# Patient Record
Sex: Female | Born: 2000 | Race: Black or African American | Hispanic: No | Marital: Married | Smoking: Never smoker
Health system: Southern US, Community
[De-identification: ages and names within clinical notes are randomized; demographics above are authoritative.]

## PROBLEM LIST (undated history)

## (undated) ENCOUNTER — Ambulatory Visit (HOSPITAL_COMMUNITY): Admission: EM | Payer: No Typology Code available for payment source | Source: Home / Self Care

## (undated) ENCOUNTER — Emergency Department (HOSPITAL_COMMUNITY): Admission: EM | Payer: PRIVATE HEALTH INSURANCE | Source: Home / Self Care

## (undated) DIAGNOSIS — K5090 Crohn's disease, unspecified, without complications: Secondary | ICD-10-CM

## (undated) DIAGNOSIS — J45909 Unspecified asthma, uncomplicated: Secondary | ICD-10-CM

## (undated) HISTORY — DX: Unspecified asthma, uncomplicated: J45.909

## (undated) HISTORY — PX: NO PAST SURGERIES: SHX2092

---
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
## 2020-05-26 ENCOUNTER — Encounter (HOSPITAL_COMMUNITY): Payer: Self-pay | Admitting: Emergency Medicine

## 2020-05-26 ENCOUNTER — Other Ambulatory Visit: Payer: Self-pay

## 2020-05-26 ENCOUNTER — Emergency Department (HOSPITAL_COMMUNITY): Payer: PRIVATE HEALTH INSURANCE

## 2020-05-26 ENCOUNTER — Emergency Department (HOSPITAL_COMMUNITY)
Admission: EM | Admit: 2020-05-26 | Discharge: 2020-05-26 | Disposition: A | Discharge status: Home / Self Care | Payer: PRIVATE HEALTH INSURANCE | Attending: Emergency Medicine | Admitting: Emergency Medicine

## 2020-05-26 DIAGNOSIS — Z20822 Contact with and (suspected) exposure to covid-19: Secondary | ICD-10-CM | POA: Insufficient documentation

## 2020-05-26 DIAGNOSIS — R0789 Other chest pain: Secondary | ICD-10-CM | POA: Diagnosis not present

## 2020-05-26 DIAGNOSIS — Z8616 Personal history of COVID-19: Secondary | ICD-10-CM | POA: Diagnosis not present

## 2020-05-26 DIAGNOSIS — J45909 Unspecified asthma, uncomplicated: Secondary | ICD-10-CM | POA: Insufficient documentation

## 2020-05-26 DIAGNOSIS — J069 Acute upper respiratory infection, unspecified: Secondary | ICD-10-CM | POA: Insufficient documentation

## 2020-05-26 DIAGNOSIS — R0981 Nasal congestion: Secondary | ICD-10-CM

## 2020-05-26 HISTORY — DX: Crohn's disease, unspecified, without complications: K50.90

## 2020-05-26 MED ORDER — ALBUTEROL SULFATE HFA 108 (90 BASE) MCG/ACT IN AERS: 2.0000 | INHALATION_SPRAY | RESPIRATORY_TRACT | 0 refills | Status: DC | PRN

## 2020-05-26 MED ORDER — AEROCHAMBER PLUS FLO-VU MEDIUM MISC
1.0000 | Freq: Once | Status: AC
  Administered 2020-05-26: 1
  Filled 2020-05-26 (×2): qty 1

## 2020-05-26 MED ORDER — BENZONATATE 100 MG PO CAPS: 100.0000 mg | ORAL_CAPSULE | Freq: Three times a day (TID) | ORAL | 0 refills | Status: DC | PRN

## 2020-05-26 MED ORDER — ALBUTEROL SULFATE HFA 108 (90 BASE) MCG/ACT IN AERS
2.0000 | INHALATION_SPRAY | RESPIRATORY_TRACT | Status: DC | PRN
  Administered 2020-05-26: 2 via RESPIRATORY_TRACT
  Filled 2020-05-26: qty 6.7

## 2020-05-26 MED ORDER — FLUTICASONE PROPIONATE 50 MCG/ACT NA SUSP: 2.0000 | Freq: Every day | NASAL | 0 refills | Status: DC

## 2020-05-26 MED ORDER — NAPROXEN 250 MG PO TABS: 500.0000 mg | ORAL_TABLET | Freq: Two times a day (BID) | ORAL | 0 refills | Status: DC | PRN

## 2020-05-26 NOTE — ED Triage Notes (Signed)
Pt c/o cough, chest pain/buring in left upper chest, shob, body aches. Endorses nausea, denies vomiting. Denies covid exposure. Sx began 2-3 days ago. Pt states she's taken mucinex with no relief.

## 2020-05-26 NOTE — Discharge Instructions (Addendum)
1. Medications: flonase, tessalon, albuterol, naprosyn, usual home medications 2. Treatment: rest, drink plenty of fluids, take tylenol or ibuprofen for fever control 3. Follow Up: Please followup with your primary doctor in 3 days for discussion of your diagnoses and further evaluation after today's visit; if you do not have a primary care doctor use the resource guide provided to find one; Return to the ER for high fevers, difficulty breathing or other concerning symptoms

## 2020-05-26 NOTE — ED Provider Notes (Signed)
[facility]-EMERGENCY DEPT Provider Note   CSN: [id number] Arrival date & time: 05/26/20  2206     History No chief complaint on file.   Caitlin Anthony is a 19 y.o. female with a hx of asthma, crohn's disease (on [place]) presents to the Emergency Department complaining of gradual, persistent, progressively worsening URI symptoms onset 3 days ago.  Pt reports associated nasal congestion, sore throat, bilateral otalgia and myalgias, cough, nausea without vomiting.  Patient reports that she does have some chest wall pain with coughing but no chest pain with breathing or exertion or at rest.  Patient reports taking Flonase and Mucinex without significant improvement.  She had Covid in August 2021.  She is not currently vaccinated.    The history is provided by the patient and medical records. No language interpreter was used.       Past Medical History:  Diagnosis Date  . Asthma   . Crohn's disease (HCC)     There are no problems to display for this patient.   History reviewed. No pertinent surgical history.   OB History   No obstetric history on file.     Family History  Problem Relation Age of Onset  . Asthma Other   . Diabetes Other     Social History   Tobacco Use  . Smoking status: Never Smoker  . Smokeless tobacco: Never Used  Substance Use Topics  . Alcohol use: Never  . Drug use: Never    Home Medications Prior to Admission medications   Medication Sig Start Date End Date Taking? Authorizing Provider  albuterol (VENTOLIN HFA) 108 (90 Base) MCG/ACT inhaler Inhale 2 puffs into the lungs every 2 (two) hours as needed for wheezing or shortness of breath (cough). 05/26/20   Kemara Quigley, Dahlia Client, PA-C  benzonatate (TESSALON PERLES) 100 MG capsule Take 1 capsule (100 mg total) by mouth 3 (three) times daily as needed for cough (cough). 05/26/20   Tanessa Tidd, Dahlia Client, PA-C  buPROPion (WELLBUTRIN XL) 300 MG 24 hr tablet Take 300 mg by mouth daily.     [provider]  busPIRone (BUSPAR) 10 MG tablet Take 10 mg by mouth 3 (three) times daily.    [provider]  fluticasone (FLONASE) 50 MCG/ACT nasal spray Place 2 sprays into both nostrils daily. 05/26/20   Kyrin Gratz, Dahlia Client, PA-C  naproxen (NAPROSYN) 250 MG tablet Take 2 tablets (500 mg total) by mouth 2 (two) times daily as needed for mild pain. 05/26/20   Wafaa Deemer, Dahlia Client, PA-C  phentermine (ADIPEX-P) 37.5 MG tablet Take 37.5 mg by mouth daily. 10/17/19   [provider]  Vitamin D, Ergocalciferol, (DRISDOL) 1.25 MG (50000 UNIT) CAPS capsule Take 50,000 Units by mouth every Monday. 09/19/19   [provider]    Allergies    Pineapple  Review of Systems   Review of Systems  Constitutional: Positive for appetite change and fatigue. Negative for diaphoresis, fever and unexpected weight change.  HENT: Positive for congestion, postnasal drip, rhinorrhea, sinus pressure and sore throat. Negative for mouth sores.   Eyes: Negative for visual disturbance.  Respiratory: Positive for cough and shortness of breath. Negative for chest tightness and wheezing.   Cardiovascular: Negative for chest pain.  Gastrointestinal: Positive for nausea. Negative for abdominal pain, constipation, diarrhea and vomiting.  Endocrine: Negative for polydipsia, polyphagia and polyuria.  Genitourinary: Negative for dysuria, frequency, hematuria and urgency.  Musculoskeletal: Negative for back pain and neck stiffness.  Skin: Negative for rash.  Allergic/Immunologic: Negative for immunocompromised  state.  Neurological: Negative for syncope, light-headedness and headaches.  Hematological: Does not bruise/bleed easily.  Psychiatric/Behavioral: Negative for sleep disturbance. The patient is not nervous/anxious.     Physical Exam Updated Vital Signs BP (!) 159/88   Pulse 94   Temp 98.5 F (36.9 C) (Oral)   Resp 18   Ht 5\' 4"  (1.626 m)   Wt (!) 140.6 kg   LMP 04/30/2020    SpO2 99%   BMI 53.21 kg/m   Physical Exam Vitals and nursing note reviewed.  Constitutional:      General: She is not in acute distress.    Appearance: She is not diaphoretic.  HENT:     Head: Normocephalic and atraumatic.     Right Ear: Tympanic membrane normal.     Left Ear: Tympanic membrane normal.     Nose: Congestion and rhinorrhea present.     Mouth/Throat:     Mouth: Mucous membranes are moist.     Pharynx: Oropharynx is clear. No pharyngeal swelling, oropharyngeal exudate, posterior oropharyngeal erythema or uvula swelling.  Eyes:     General: No scleral icterus.    Conjunctiva/sclera: Conjunctivae normal.  Cardiovascular:     Rate and Rhythm: Normal rate and regular rhythm.     Pulses: Normal pulses.          Radial pulses are 2+ on the right side and 2+ on the left side.  Pulmonary:     Effort: No tachypnea, accessory muscle usage, prolonged expiration, respiratory distress or retractions.     Breath sounds: Normal breath sounds. No stridor.     Comments: Equal chest rise. No increased work of breathing. Abdominal:     General: There is no distension.     Palpations: Abdomen is soft.     Tenderness: There is no abdominal tenderness. There is no guarding or rebound.  Musculoskeletal:     Cervical back: Normal range of motion.     Comments: Moves all extremities equally and without difficulty.  Skin:    General: Skin is warm and dry.     Capillary Refill: Capillary refill takes less than 2 seconds.  Neurological:     Mental Status: She is alert.     GCS: GCS eye subscore is 4. GCS verbal subscore is 5. GCS motor subscore is 6.     Comments: Speech is clear and goal oriented.  Psychiatric:        Mood and Affect: Mood normal.     ED Results / Procedures / Treatments   Labs (all labs ordered are listed, but only abnormal results are displayed) Labs Reviewed  RESPIRATORY PANEL BY RT PCR (FLU A&B, COVID)    EKG None  Radiology DG Chest Port 1  View  Result Date: 05/26/2020 CLINICAL DATA:  Nonproductive cough and congestion for 3 days. History of COVID infection a couple of months ago. Asthma. Nonsmoker. EXAM: PORTABLE CHEST 1 VIEW COMPARISON:  None. FINDINGS: The heart size and mediastinal contours are within normal limits. Both lungs are clear. The visualized skeletal structures are unremarkable. IMPRESSION: No active disease. Electronically Signed   By: 05/28/2020 M.D.   On: 05/26/2020 22:37    Procedures Procedures (including critical care time)  Medications Ordered in ED Medications  albuterol (VENTOLIN HFA) 108 (90 Base) MCG/ACT inhaler 2 puff (has no administration in time range)  AeroChamber Plus Flo-Vu Medium MISC 1 each (has no administration in time range)    ED Course  I have reviewed the triage vital  signs and the nursing notes.  Pertinent labs & imaging results that were available during my care of the patient were reviewed by me and considered in my medical decision making (see chart for details).    MDM Rules/Calculators/A&P                           Caitlin Anthony was evaluated in Emergency Department on 05/26/2020 for the symptoms described in the history of present illness. She was evaluated in the context of the global COVID-19 pandemic, which necessitated consideration that the patient might be at risk for infection with the SARS-CoV-2 virus that causes COVID-19. Institutional protocols and algorithms that pertain to the evaluation of patients at risk for COVID-19 are in a state of rapid change based on information released by regulatory bodies including the CDC and federal and state organizations. These policies and algorithms were followed during the patient's care in the ED.  Patient presents with URI symptoms.  Recently had Covid.  No hypoxia today.  No wheezing on my exam.  Afebrile.  Chest x-ray without evidence of consolidation.  Suspect viral URI.  Covid test pending.  Patient will follow Covid test  on MyChart.  Symptomatic therapy given.  Discussed reasons to return immediately to the emergency department.  Patient states understanding and is in agreement the plan.  Final Clinical Impression(s) / ED Diagnoses Final diagnoses:  Upper respiratory tract infection, unspecified type  Nasal congestion    Rx / DC Orders ED Discharge Orders         Ordered    fluticasone (FLONASE) 50 MCG/ACT nasal spray  Daily        05/26/20 2304    albuterol (VENTOLIN HFA) 108 (90 Base) MCG/ACT inhaler  Every 2 hours PRN        05/26/20 2304    naproxen (NAPROSYN) 250 MG tablet  2 times daily PRN        05/26/20 2304    benzonatate (TESSALON PERLES) 100 MG capsule  3 times daily PRN        05/26/20 2304           Demontay Grantham, Boyd Kerbs 05/26/20 2305    Lorre Nick, MD 05/27/20 1540

## 2020-05-27 LAB — RESPIRATORY PANEL BY RT PCR (FLU A&B, COVID)
Influenza A by PCR: NEGATIVE
Influenza B by PCR: NEGATIVE
SARS Coronavirus 2 by RT PCR: NEGATIVE

## 2020-08-01 ENCOUNTER — Other Ambulatory Visit: Payer: Self-pay

## 2020-09-23 IMAGING — CT CT ABD-PELV W/ CM
2 of 4 series · 17 of 46 positions shown, 19 images · IV contrast (omnipaque)
Comparison: None.

CLINICAL DATA: Generalized abdominal pain, question of Crohn's

EXAM:
CT ABDOMEN AND PELVIS WITH CONTRAST
TECHNIQUE: Multidetector CT imaging of the abdomen and pelvis was performed
using the standard protocol following bolus administration of
intravenous contrast.
CONTRAST:  100mL OMNIPAQUE IOHEXOL 300 MG/ML  SOLN

[Series 3: abdomen 5.0 · axial · 0.97mm/px · z∈[+610,+1035]mm · 14 of 97 slices shown, 16 images]
[im 6/97  soft-tissue]
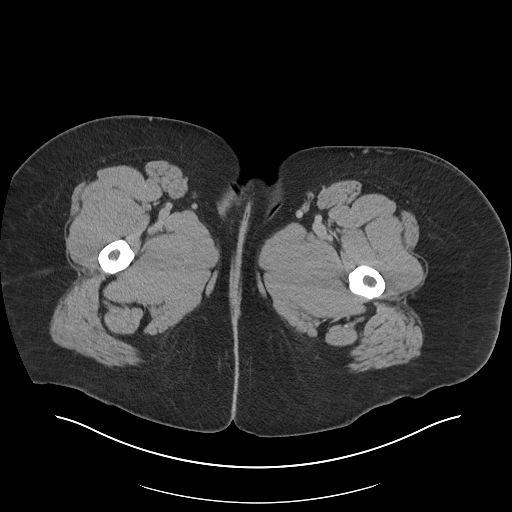
[im 6/97  bone]
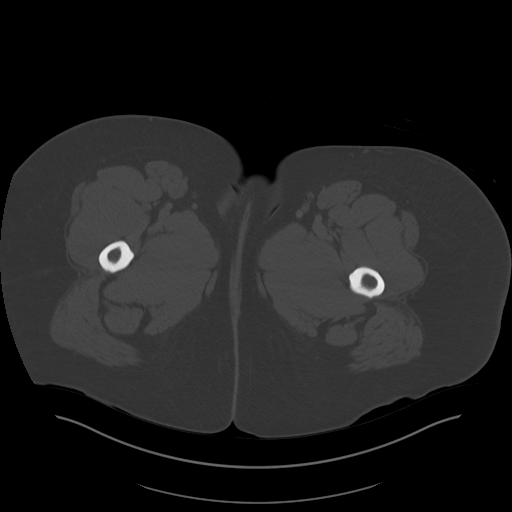
[im 11/97  soft-tissue]
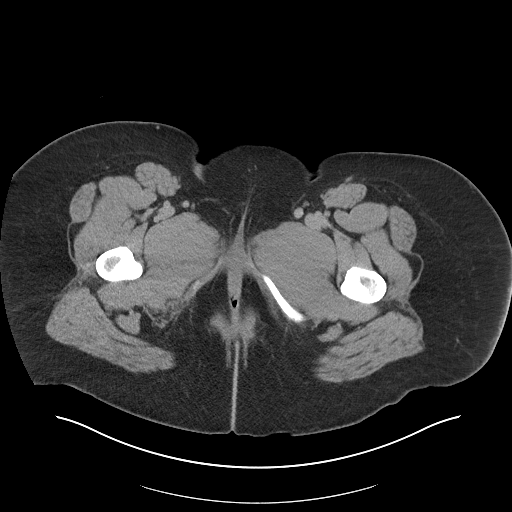
[im 22/97  soft-tissue]
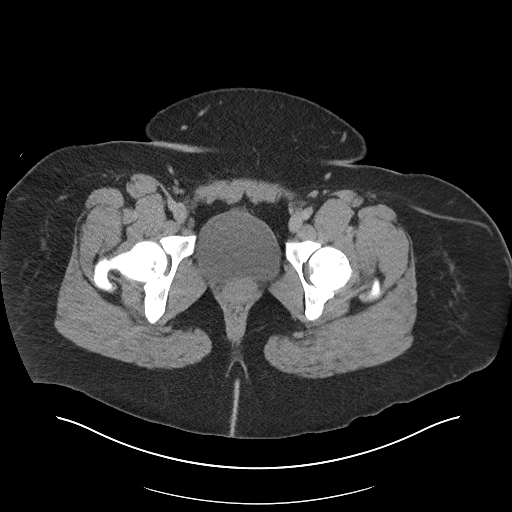
[im 27/97  soft-tissue]
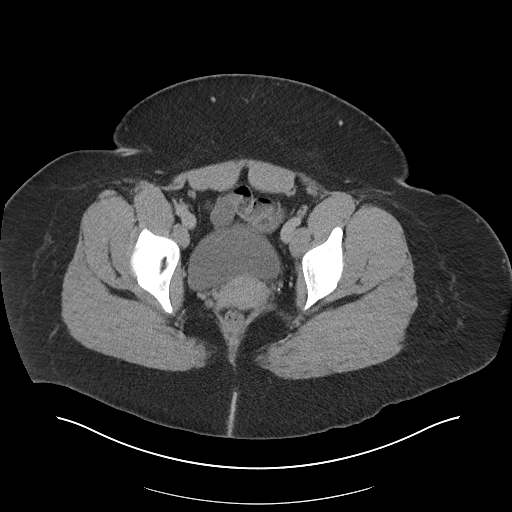
[im 33/97  soft-tissue]
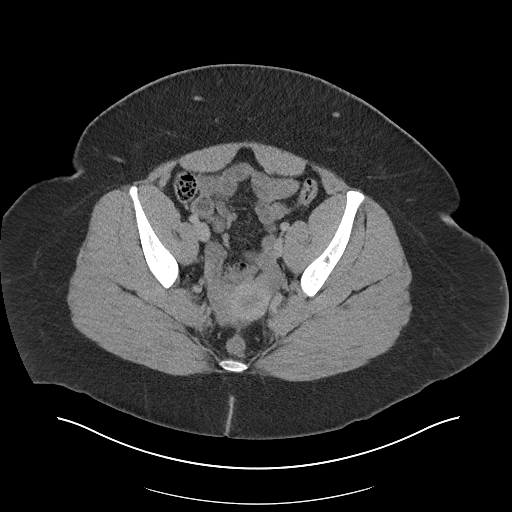
[im 38/97  soft-tissue]
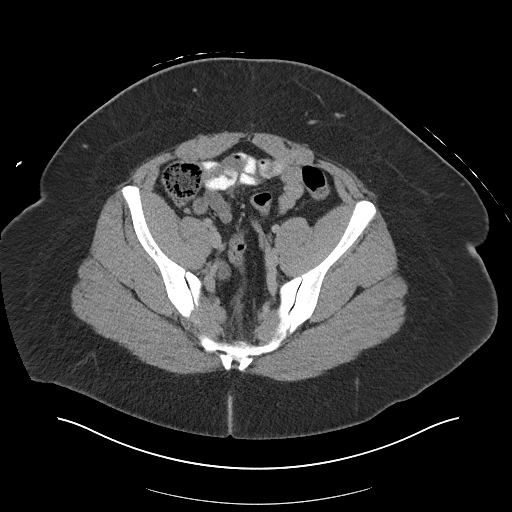
[im 43/97  soft-tissue]
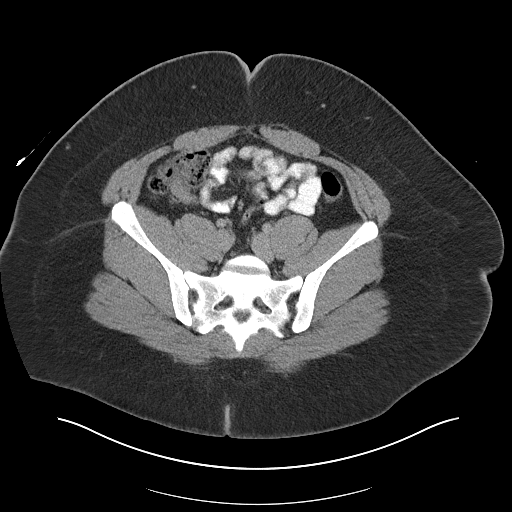
[im 54/97  soft-tissue]
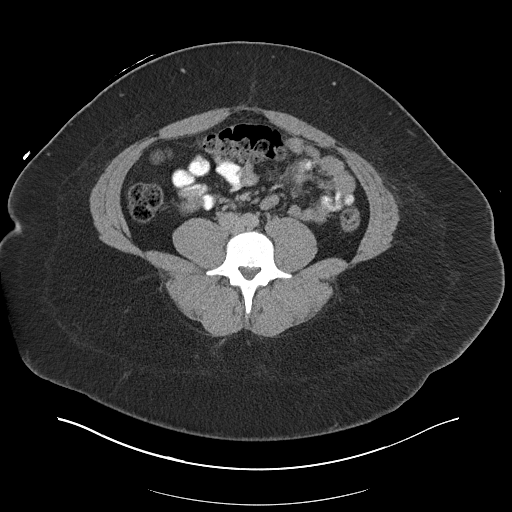
[im 59/97  soft-tissue]
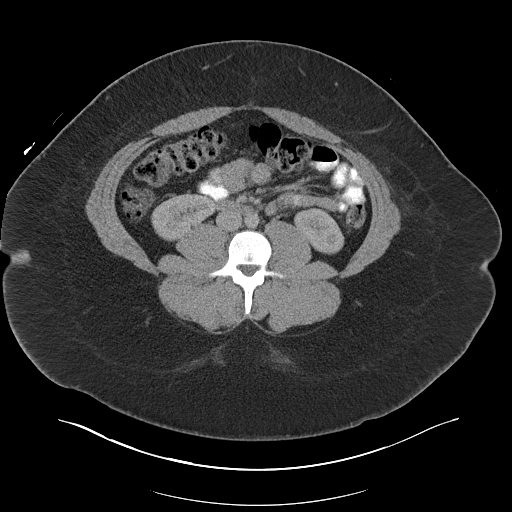
[im 59/97  bone]
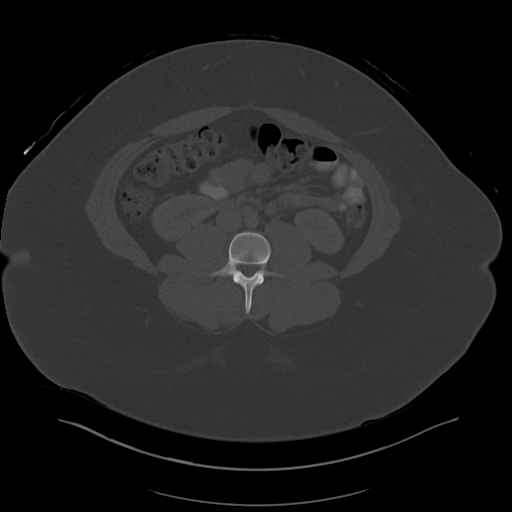
[im 65/97  soft-tissue]
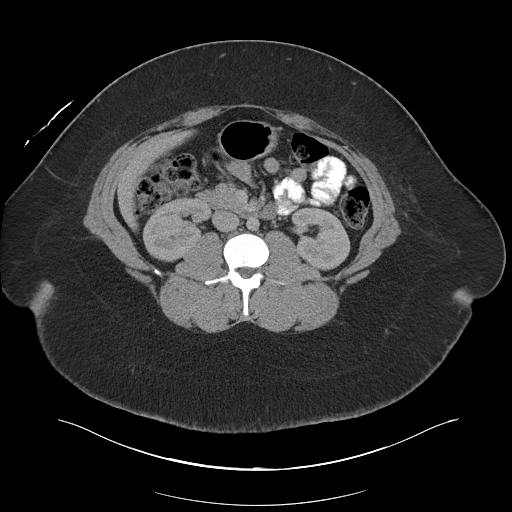
[im 70/97  soft-tissue]
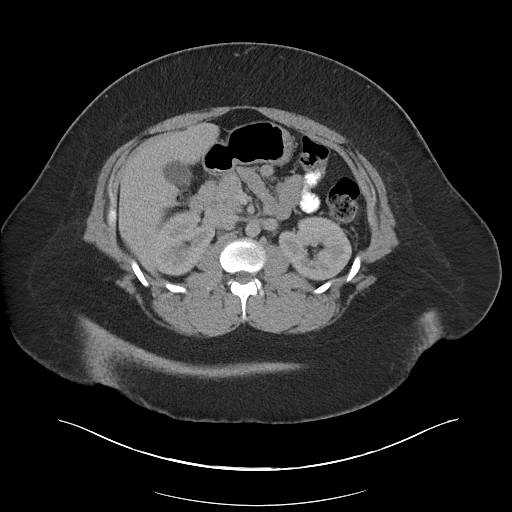
[im 75/97  soft-tissue]
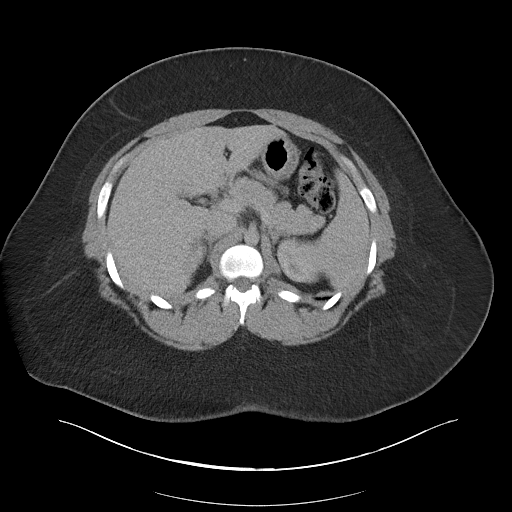
[im 86/97  soft-tissue]
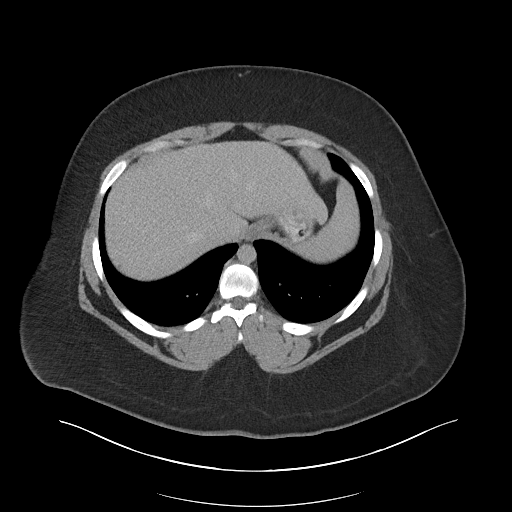
[im 91/97  soft-tissue]
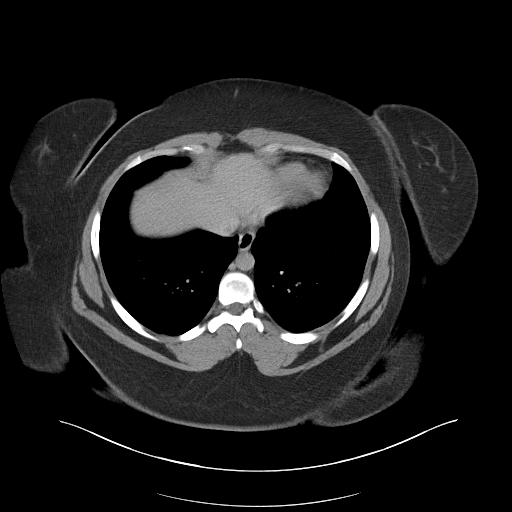

[Series 6: abdomen 3.0 mpr cor · coronal · 0.91mm/px · 3 of 132 slices shown]
[im 44/132  soft-tissue]
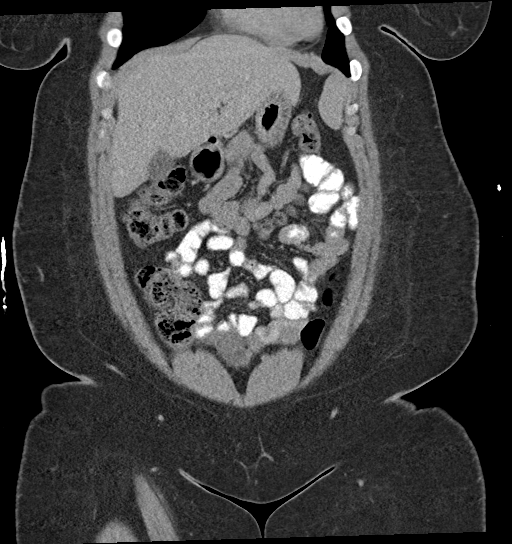
[im 59/132  soft-tissue]
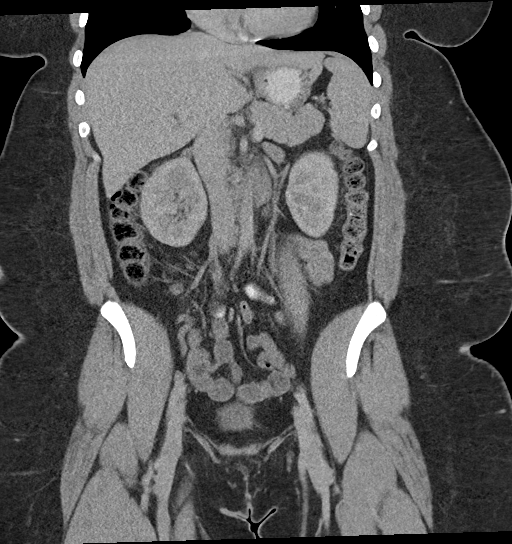
[im 73/132  soft-tissue]
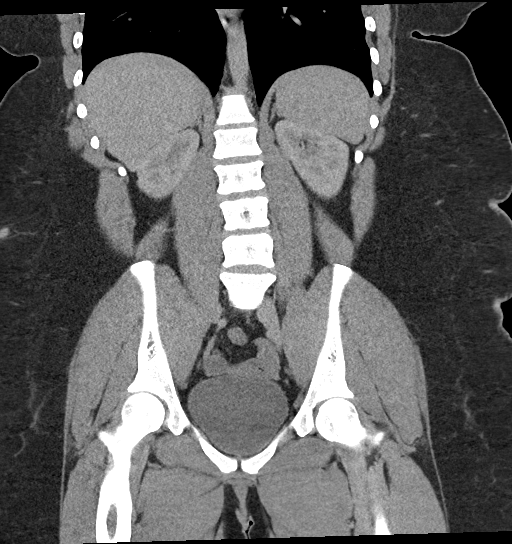

[17 of 46 positions shown; findings below may reference images not displayed]

FINDINGS: Lower chest: The visualized heart size within normal limits. No
pericardial fluid/thickening.

No hiatal hernia.

The visualized portions of the lungs are clear.

Hepatobiliary: The liver is normal in density without focal
abnormality.The main portal vein is patent. No evidence of calcified
gallstones, gallbladder wall thickening or biliary dilatation.

Pancreas: Unremarkable. No pancreatic ductal dilatation or
surrounding inflammatory changes.

Spleen: Normal in size without focal abnormality.

Adrenals/Urinary Tract: Both adrenal glands appear normal. The
kidneys and collecting system appear normal without evidence of
urinary tract calculus or hydronephrosis. Bladder is unremarkable.

Stomach/Bowel: The stomach, small bowel, and colon are normal in
appearance. No inflammatory changes, wall thickening, or obstructive
findings.The appendix is normal.

Vascular/Lymphatic: There are no enlarged mesenteric,
retroperitoneal, or pelvic lymph nodes. No significant vascular
findings are present.

Reproductive: The uterus and adnexa are unremarkable.

Other: Small fat containing anterior umbilical hernia is noted.

Musculoskeletal: No acute or significant osseous findings.
IMPRESSION: No acute intra-abdominal or pelvic pathology to explain the
patient's symptoms.

## 2021-04-24 IMAGING — DX DG CHEST 1V PORT
1 series · 1 of 1 positions shown · non-contrast
Comparison: None.

CLINICAL DATA: Nonproductive cough and congestion for 3 days.
History of COVID infection a couple of months ago. Asthma.
Nonsmoker.

EXAM:
PORTABLE CHEST 1 VIEW

[chest ap]
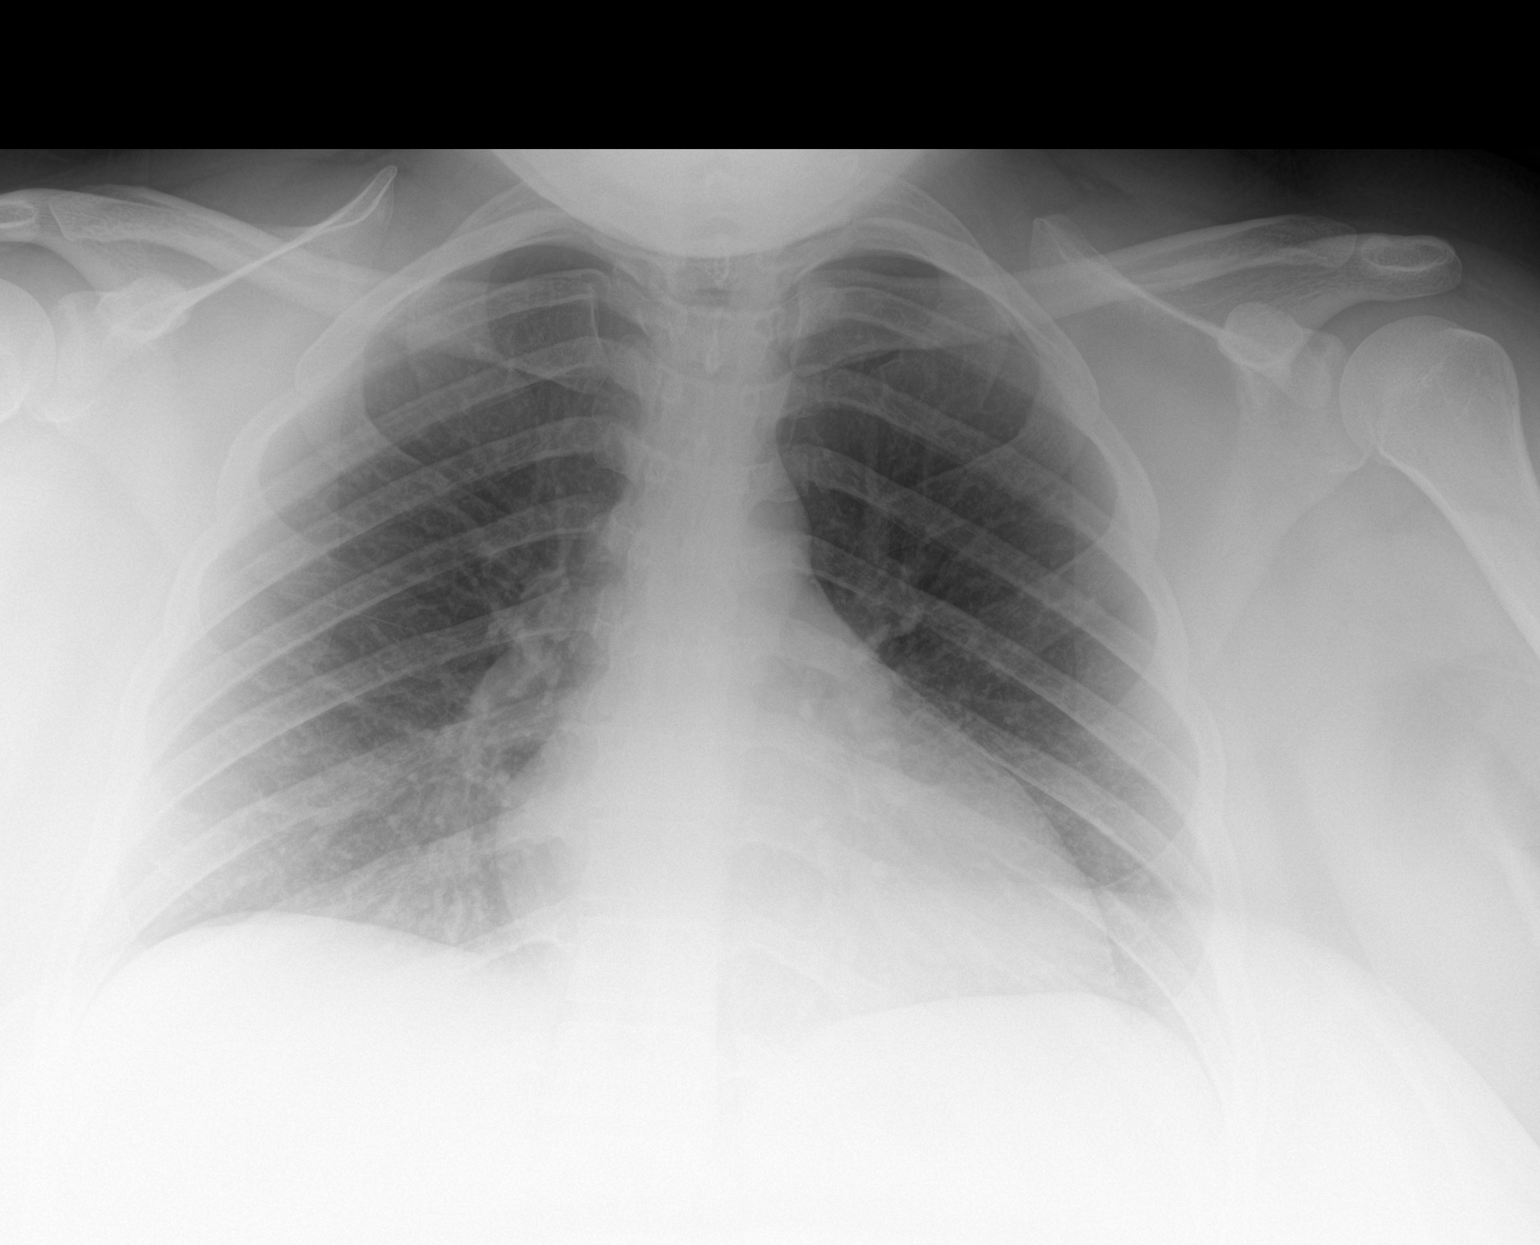

[1 of 1 positions shown; findings below may reference images not displayed]

FINDINGS: The heart size and mediastinal contours are within normal limits.
Both lungs are clear. The visualized skeletal structures are
unremarkable.
IMPRESSION: No active disease.

## 2021-11-16 ENCOUNTER — Ambulatory Visit (HOSPITAL_COMMUNITY)
Admission: EM | Admit: 2021-11-16 | Discharge: 2021-11-16 | Disposition: A | Discharge status: Home / Self Care | Payer: PRIVATE HEALTH INSURANCE | Attending: Nurse Practitioner | Admitting: Nurse Practitioner

## 2021-11-16 ENCOUNTER — Encounter (HOSPITAL_COMMUNITY): Payer: Self-pay | Admitting: Nurse Practitioner

## 2021-11-16 DIAGNOSIS — W25XXXA Contact with sharp glass, initial encounter: Secondary | ICD-10-CM

## 2021-11-16 DIAGNOSIS — Z23 Encounter for immunization: Secondary | ICD-10-CM

## 2021-11-16 DIAGNOSIS — S91342A Puncture wound with foreign body, left foot, initial encounter: Secondary | ICD-10-CM

## 2021-11-16 MED ORDER — TETANUS-DIPHTH-ACELL PERTUSSIS 5-2.5-18.5 LF-MCG/0.5 IM SUSY
PREFILLED_SYRINGE | INTRAMUSCULAR | Status: AC
  Filled 2021-11-16: qty 0.5

## 2021-11-16 MED ORDER — LIDOCAINE HCL (PF) 1 % IJ SOLN
INTRAMUSCULAR | Status: AC
  Filled 2021-11-16: qty 2

## 2021-11-16 MED ORDER — TETANUS-DIPHTH-ACELL PERTUSSIS 5-2.5-18.5 LF-MCG/0.5 IM SUSY
0.5000 mL | PREFILLED_SYRINGE | Freq: Once | INTRAMUSCULAR | Status: AC
  Administered 2021-11-16: 0.5 mL via INTRAMUSCULAR

## 2021-11-16 NOTE — ED Provider Notes (Signed)
?[facility] ? ? ? ?CSN: [id number] ?Arrival date & time: 11/16/21  1721 ? ? ?  ? ?History   ?Chief Complaint ?Chief Complaint  ?Patient presents with  ? Puncture Wound  ? ? ?HPI ?Caitlin Anthony is a 21 y.o. female.  ? ?The patient is a 21 year old female who presents with glass in the right foot.  Patient states that she was at home she stepped down and felt the glass into her foot.  States that she and her friend tried to take it out but she could not grab it.  She has pain with walking.  The glass is located in the lateral aspect of the right forefoot.  She denies fever, chills, radiation of pain.  She cannot recall when she had her last tetanus shot. ? ?The history is provided by the patient.  ? ?Past Medical History:  ?Diagnosis Date  ? Asthma   ? Crohn's disease (HCC)   ? ? ?There are no problems to display for this patient. ? ? ?History reviewed. No pertinent surgical history. ? ?OB History   ?No obstetric history on file. ?  ? ? ? ?Home Medications   ? ?Prior to Admission medications   ?Medication Sig Start Date End Date Taking? Authorizing Provider  ?albuterol (VENTOLIN HFA) 108 (90 Base) MCG/ACT inhaler Inhale 2 puffs into the lungs every 2 (two) hours as needed for wheezing or shortness of breath (cough). 05/26/20   Muthersbaugh, Dahlia Client, PA-C  ?benzonatate (TESSALON PERLES) 100 MG capsule Take 1 capsule (100 mg total) by mouth 3 (three) times daily as needed for cough (cough). 05/26/20   Muthersbaugh, Dahlia Client, PA-C  ?buPROPion (WELLBUTRIN XL) 300 MG 24 hr tablet Take 300 mg by mouth daily.    [provider]  ?busPIRone (BUSPAR) 10 MG tablet Take 10 mg by mouth 3 (three) times daily.    [provider]  ?fluticasone (FLONASE) 50 MCG/ACT nasal spray Place 2 sprays into both nostrils daily. 05/26/20   Muthersbaugh, Dahlia Client, PA-C  ?naproxen (NAPROSYN) 250 MG tablet Take 2 tablets (500 mg total) by mouth 2 (two) times daily as needed for mild pain. 05/26/20   Muthersbaugh, Dahlia Client, PA-C   ?phentermine (ADIPEX-P) 37.5 MG tablet Take 37.5 mg by mouth daily. 10/17/19   [provider]  ?Vitamin D, Ergocalciferol, (DRISDOL) 1.25 MG (50000 UNIT) CAPS capsule Take 50,000 Units by mouth every Monday. 09/19/19   [provider]  ? ? ?Family History ?Family History  ?Problem Relation Age of Onset  ? Asthma Other   ? Diabetes Other   ? ? ?Social History ?Social History  ? ?Tobacco Use  ? Smoking status: Never  ? Smokeless tobacco: Never  ?Substance Use Topics  ? Alcohol use: Never  ? Drug use: Never  ? ? ? ?Allergies   ?Pineapple ? ? ?Review of Systems ?Review of Systems  ?Constitutional: Negative.   ?Skin:   ?     Glass in right foot.  ?Psychiatric/Behavioral: Negative.    ? ? ?Physical Exam ?Triage Vital Signs ?ED Triage Vitals  ?Enc Vitals Group  ?   BP 11/16/21 1812 (!) 138/93  ?   Pulse Rate 11/16/21 1812 92  ?   Resp 11/16/21 1812 16  ?   Temp 11/16/21 1812 99.1 ?F (37.3 ?C)  ?   Temp Source 11/16/21 1812 Oral  ?   SpO2 11/16/21 1812 100 %  ?   Weight --   ?   Height --   ?   Head  Circumference --   ?   Peak Flow --   ?   Pain Score 11/16/21 1813 6  ?   Pain Loc --   ?   Pain Edu? --   ?   Excl. in GC? --   ? ?No data found. ? ?Updated Vital Signs ?BP (!) 138/93 (BP Location: Left Arm)   Pulse 92   Temp 99.1 ?F (37.3 ?C) (Oral)   Resp 16   LMP 11/03/2021   SpO2 100%  ? ?Visual Acuity ?Right Eye Distance:   ?Left Eye Distance:   ?Bilateral Distance:   ? ?Right Eye Near:   ?Left Eye Near:    ?Bilateral Near:    ? ?Physical Exam ?Vitals reviewed.  ?Constitutional:   ?   Appearance: Normal appearance.  ?Skin: ?   General: Skin is warm and dry.  ?   Capillary Refill: Capillary refill takes less than 2 seconds.  ?   Comments: Tenderness to lateral aspect of the right forefoot. Small puncture wound noted to the forefoot with scant bleeding noted. Able to palpate object in the area.  ?Neurological:  ?   General: No focal deficit present.  ?   Mental Status: She is alert and oriented to  person, place, and time.  ? ? ? ?UC Treatments / Results  ?Labs ?(all labs ordered are listed, but only abnormal results are displayed) ?Labs Reviewed - No data to display ? ?EKG ? ? ?Radiology ?No results found. ? ?Procedures ?Foreign Body Removal ? ?Date/Time: 11/16/2021 7:14 PM ?Performed by: Abran Cantor, NP ?Authorized by: Abran Cantor, NP  ? ?Consent:  ?  Consent obtained:  Verbal ?  Consent given by:  Patient ?  Risks discussed:  Bleeding and infection ?  Alternatives discussed:  No treatment ?Universal protocol:  ?  Patient identity confirmed:  Verbally with patient and arm band ?Location:  ?  Location:  Foot ?  Foot location: lateral forefoot. ?  Depth:  Subcutaneous ?  Tendon involvement:  None ?Pre-procedure details:  ?  Imaging:  None ?Anesthesia:  ?  Anesthesia method:  Local infiltration ?  Local anesthetic:  Lidocaine 1% w/o epi ?Procedure type:  ?  Procedure complexity:  Simple ?Procedure details:  ?  Localization method:  Probed ?  Dissection of underlying tissues: no   ?  Foreign bodies recovered:  1 ?  Description:  81mm piece of glass removed from the right foot ?  Intact foreign body removal: yes   ?Post-procedure details:  ?  Neurovascular status: intact   ?  Procedure completion:  Tolerated well, no immediate complications (including critical care time) ? ?Medications Ordered in UC ?Medications  ?Tdap (BOOSTRIX) injection 0.5 mL (has no administration in time range)  ? ? ?Initial Impression / Assessment and Plan / UC Course  ?I have reviewed the triage vital signs and the nursing notes. ? ?Pertinent labs & imaging results that were available during my care of the patient were reviewed by me and considered in my medical decision making (see chart for details). ? ?Patient is a 21 year old female who presents after stepping on a piece of glass with the right foot.  Patient states she could feel the object in the bottom of the foot.  On exam, they foreign object is palpated  in the right foot.  Removal of the foreign object was completed with the assistance of Dewaine Conger, FNP.  A 5 mm piece of glass was removed, with immediate relief from the  patient.  Patient was advised that she may have continued soreness until the area heals completely.  Wear shoes that provide good padding and support.  Monitor for signs of infection to include fever, chills, malaise, foul-smelling drainage, redness or streaking.  Follow-up as needed. ?Final Clinical Impressions(s) / UC Diagnoses  ? ?Final diagnoses:  ?Injury from broken glass, initial encounter  ? ? ? ?Discharge Instructions   ? ?  ?A 5 mm piece of glass was removed from the right foot today. ?You may continue to have soreness in the foot until healing is complete. ?You may take over-the-counter ibuprofen as needed for pain, fever, or general discomfort. ?Your tetanus shot was also updated today.  You will be good for the next 10 years. ?Wear shoes with good padding to help alleviate some of the pressure on the right foot. ?Monitor for signs of infection to include increased redness, drainage, fever, chills, or general fatigue. ?Follow-up as needed. ? ? ? ? ?ED Prescriptions   ?None ?  ? ?PDMP not reviewed this encounter. ?  ?Abran Cantor, NP ?11/16/21 1923 ? ?

## 2021-11-16 NOTE — Discharge Instructions (Signed)
A 5 mm piece of glass was removed from the right foot today. ?You may continue to have soreness in the foot until healing is complete. ?You may take over-the-counter ibuprofen as needed for pain, fever, or general discomfort. ?Your tetanus shot was also updated today.  You will be good for the next 10 years. ?Wear shoes with good padding to help alleviate some of the pressure on the right foot. ?Monitor for signs of infection to include increased redness, drainage, fever, chills, or general fatigue. ?Follow-up as needed. ?

## 2021-11-16 NOTE — ED Triage Notes (Signed)
C/o glass in her right foot.  ?

## 2022-01-31 ENCOUNTER — Encounter (HOSPITAL_BASED_OUTPATIENT_CLINIC_OR_DEPARTMENT_OTHER): Payer: Self-pay | Admitting: Emergency Medicine

## 2022-01-31 ENCOUNTER — Emergency Department (HOSPITAL_BASED_OUTPATIENT_CLINIC_OR_DEPARTMENT_OTHER)
Admission: EM | Admit: 2022-01-31 | Discharge: 2022-01-31 | Disposition: A | Discharge status: Home / Self Care | Payer: Medicaid Other | Attending: Emergency Medicine | Admitting: Emergency Medicine

## 2022-01-31 ENCOUNTER — Other Ambulatory Visit: Payer: Self-pay

## 2022-01-31 DIAGNOSIS — R1032 Left lower quadrant pain: Secondary | ICD-10-CM

## 2022-01-31 LAB — CBC
HCT: 39.5 % (ref 36.0–46.0)
Hemoglobin: 12.3 g/dL (ref 12.0–15.0)
MCH: 24.1 pg — ABNORMAL LOW (ref 26.0–34.0)
MCHC: 31.1 g/dL (ref 30.0–36.0)
MCV: 77.5 fL — ABNORMAL LOW (ref 80.0–100.0)
Platelets: 370 10*3/uL (ref 150–400)
RBC: 5.1 MIL/uL (ref 3.87–5.11)
RDW: 14.6 % (ref 11.5–15.5)
WBC: 5 10*3/uL (ref 4.0–10.5)
nRBC: 0 % (ref 0.0–0.2)

## 2022-01-31 LAB — URINALYSIS, ROUTINE W REFLEX MICROSCOPIC
Bilirubin Urine: NEGATIVE
Glucose, UA: NEGATIVE mg/dL
Ketones, ur: NEGATIVE mg/dL
Leukocytes,Ua: NEGATIVE
Nitrite: NEGATIVE
Protein, ur: 30 mg/dL — AB
Specific Gravity, Urine: 1.02 (ref 1.005–1.030)
pH: 7 (ref 5.0–8.0)

## 2022-01-31 LAB — COMPREHENSIVE METABOLIC PANEL
ALT: 21 U/L (ref 0–44)
AST: 24 U/L (ref 15–41)
Albumin: 3.9 g/dL (ref 3.5–5.0)
Alkaline Phosphatase: 66 U/L (ref 38–126)
Anion gap: 7 (ref 5–15)
BUN: 12 mg/dL (ref 6–20)
CO2: 23 mmol/L (ref 22–32)
Calcium: 9.1 mg/dL (ref 8.9–10.3)
Chloride: 107 mmol/L (ref 98–111)
Creatinine, Ser: 0.79 mg/dL (ref 0.44–1.00)
GFR, Estimated: >60 mL/min (ref >60)
Glucose, Bld: 119 mg/dL — ABNORMAL HIGH (ref 70–99)
Potassium: 3.7 mmol/L (ref 3.5–5.1)
Sodium: 137 mmol/L (ref 135–145)
Total Bilirubin: 0.4 mg/dL (ref 0.3–1.2)
Total Protein: 8.5 g/dL — ABNORMAL HIGH (ref 6.5–8.1)

## 2022-01-31 LAB — PREGNANCY, URINE: Preg Test, Ur: NEGATIVE

## 2022-01-31 LAB — LIPASE, BLOOD: Lipase: 27 U/L (ref 11–51)

## 2022-01-31 LAB — URINALYSIS, MICROSCOPIC (REFLEX): RBC / HPF: >50 RBC/hpf (ref 0–5)

## 2022-01-31 MED ORDER — IBUPROFEN 400 MG PO TABS
600.0000 mg | ORAL_TABLET | Freq: Once | ORAL | Status: AC
  Administered 2022-01-31: 600 mg via ORAL
  Filled 2022-01-31: qty 1

## 2022-01-31 MED ORDER — ACETAMINOPHEN 325 MG PO TABS: 650.0000 mg | ORAL_TABLET | Freq: Four times a day (QID) | ORAL | 0 refills | Status: DC | PRN

## 2022-01-31 MED ORDER — IBUPROFEN 600 MG PO TABS: 600.0000 mg | ORAL_TABLET | Freq: Three times a day (TID) | ORAL | 0 refills | Status: DC | PRN

## 2022-01-31 MED ORDER — OXYCODONE HCL 5 MG PO TABS: 5.0000 mg | ORAL_TABLET | Freq: Four times a day (QID) | ORAL | 0 refills | Status: DC | PRN

## 2022-01-31 MED ORDER — OXYCODONE-ACETAMINOPHEN 5-325 MG PO TABS
1.0000 | ORAL_TABLET | Freq: Once | ORAL | Status: AC
  Administered 2022-01-31: 1 via ORAL
  Filled 2022-01-31: qty 1

## 2022-01-31 NOTE — ED Provider Notes (Signed)
[facility] EMERGENCY DEPARTMENT Provider Note   CSN: [id number] Arrival date & time: 01/31/22  1646     History  Chief Complaint  Patient presents with   Abdominal Pain    Caitlin Anthony is a 21 y.o. female who reports a history of Crohn's disease presenting with abdominal pain.  She reports onset about 3 days ago, has been having predominantly left upper sided abdominal pain, which is cramping, waxing and waning, but was more severe today and had her doubled over.  She reports she has loose bowel movements, denies constipation or vomiting.  Denies   bloody bowel movements.  Denies fevers.  Denies dysuria, hematuria.  She is currently on her menstrual cycle.  She denies any history of abdominal surgery.  HPI     Home Medications Prior to Admission medications   Medication Sig Start Date End Date Taking? Authorizing Provider  acetaminophen (TYLENOL) 325 MG tablet Take 2 tablets (650 mg total) by mouth every 6 (six) hours as needed for up to 30 doses. 01/31/22  Yes Terald Sleeper, MD  ibuprofen (ADVIL) 600 MG tablet Take 1 tablet (600 mg total) by mouth every 8 (eight) hours as needed for up to 21 doses for mild pain or headache. 01/31/22  Yes Yonael Tulloch, Kermit Balo, MD  oxyCODONE (ROXICODONE) 5 MG immediate release tablet Take 1 tablet (5 mg total) by mouth every 6 (six) hours as needed for up to 5 doses for severe pain or breakthrough pain. 01/31/22  Yes Jourdan Maldonado, Kermit Balo, MD  albuterol (VENTOLIN HFA) 108 (90 Base) MCG/ACT inhaler Inhale 2 puffs into the lungs every 2 (two) hours as needed for wheezing or shortness of breath (cough). 05/26/20   Muthersbaugh, Dahlia Client, PA-C  benzonatate (TESSALON PERLES) 100 MG capsule Take 1 capsule (100 mg total) by mouth 3 (three) times daily as needed for cough (cough). 05/26/20   Muthersbaugh, Dahlia Client, PA-C  buPROPion (WELLBUTRIN XL) 300 MG 24 hr tablet Take 300 mg by mouth daily.    [provider]  busPIRone (BUSPAR) 10 MG tablet Take 10  mg by mouth 3 (three) times daily.    [provider]  fluticasone (FLONASE) 50 MCG/ACT nasal spray Place 2 sprays into both nostrils daily. 05/26/20   Muthersbaugh, Dahlia Client, PA-C  naproxen (NAPROSYN) 250 MG tablet Take 2 tablets (500 mg total) by mouth 2 (two) times daily as needed for mild pain. 05/26/20   Muthersbaugh, Dahlia Client, PA-C  phentermine (ADIPEX-P) 37.5 MG tablet Take 37.5 mg by mouth daily. 10/17/19   [provider]  Vitamin D, Ergocalciferol, (DRISDOL) 1.25 MG (50000 UNIT) CAPS capsule Take 50,000 Units by mouth every Monday. 09/19/19   [provider]      Allergies    Pineapple    Review of Systems   Review of Systems  Physical Exam Updated Vital Signs BP 127/82 (BP Location: Left Arm)   Pulse 98   Temp 98.5 F (36.9 C) (Oral)   Resp 17   Ht 5\' 3"  (1.6 m)   Wt (!) 154.2 kg   SpO2 100%   BMI 60.23 kg/m  Physical Exam Constitutional:      General: She is not in acute distress.    Appearance: She is obese.  HENT:     Head: Normocephalic and atraumatic.  Eyes:     Conjunctiva/sclera: Conjunctivae normal.     Pupils: Pupils are equal, round, and reactive to light.  Cardiovascular:     Rate and Rhythm: Normal rate and regular rhythm.  Pulmonary:     Effort: Pulmonary effort is normal. No respiratory distress.  Abdominal:     General: There is no distension.     Tenderness: There is no abdominal tenderness. There is no guarding.  Skin:    General: Skin is warm and dry.  Neurological:     General: No focal deficit present.     Mental Status: She is alert. Mental status is at baseline.  Psychiatric:        Mood and Affect: Mood normal.        Behavior: Behavior normal.     ED Results / Procedures / Treatments   Labs (all labs ordered are listed, but only abnormal results are displayed) Labs Reviewed  COMPREHENSIVE METABOLIC PANEL - Abnormal; Notable for the following components:      Result Value   Glucose, Bld 119 (*)    Total  Protein 8.5 (*)    All other components within normal limits  CBC - Abnormal; Notable for the following components:   MCV 77.5 (*)    MCH 24.1 (*)    All other components within normal limits  URINALYSIS, ROUTINE W REFLEX MICROSCOPIC - Abnormal; Notable for the following components:   APPearance CLOUDY (*)    Hgb urine dipstick LARGE (*)    Protein, ur 30 (*)    All other components within normal limits  URINALYSIS, MICROSCOPIC (REFLEX) - Abnormal; Notable for the following components:   Bacteria, UA MANY (*)    All other components within normal limits  LIPASE, BLOOD  PREGNANCY, URINE    EKG None  Radiology No results found.  Procedures Procedures    Medications Ordered in ED Medications  ibuprofen (ADVIL) tablet 600 mg (600 mg Oral Given 01/31/22 2046)  oxyCODONE-acetaminophen (PERCOCET/ROXICET) 5-325 MG per tablet 1 tablet (1 tablet Oral Given 01/31/22 2046)    ED Course/ Medical Decision Making/ A&P                           Medical Decision Making Amount and/or Complexity of Data Reviewed Labs: ordered.  Risk OTC drugs. Prescription drug management.   This patient presents to the Emergency Department with complaint of abdominal pain. This involves an extensive number of treatment options, and is a complaint that carries with it a high risk of complications and morbidity.  The differential diagnosis includes, but is not limited to, gastritis vs biliary disease vs peptic ulcer vs constipation vs colitis vs UTI vs other  I ordered, reviewed, and interpreted labs, including BMP and CBC.  There were no immediate, life-threatening emergencies found in this labwork.  Patient's UA showed no signs of infection I ordered medication Motrin, Percocet for abdominal pain and/or nausea  Overall I do not see an emergent indication for CT imaging of the belly.  We discussed the option of imaging to rule out possible kidney stone, but she would prefer to avoid the cost and  radiation, I think this is reasonable, as the vast majority of stones will pass spontaneously.  I doubt GI perforation or colitis.  I do not believe she needs antibiotics at this time.  After the interventions stated above, I reevaluated the patient and found that they remained clinically stable.  Based on the patient's clinical exam, vital signs, risk factors, and ED testing, I felt that the patient's overall risk of life-threatening emergency such as bowel perforation, surgical emergency, or sepsis was quite low.  I suspect this clinical presentation  is most consistent with nonspecific abdominal pain., but explained to the patient that this evaluation was not a definitive diagnostic workup.  I discussed outpatient follow up with primary care provider, and provided specialist office number on the patient's discharge paper if a referral was deemed necessary.  I discussed return precautions with the patient. I felt the patient was clinically stable for discharge.  I did advise gastroenterology follow-up and establishing care given her reported history of Crohn's disease.  Unfortunately she is not currently insured, but she is working on getting her Medicaid back.  She says she will try to follow-up with gastroenterology when she does have her Medicaid back        Final Clinical Impression(s) / ED Diagnoses Final diagnoses:  Left lower quadrant abdominal pain    Rx / DC Orders ED Discharge Orders          Ordered    ibuprofen (ADVIL) 600 MG tablet  Every 8 hours PRN        01/31/22 2053    acetaminophen (TYLENOL) 325 MG tablet  Every 6 hours PRN        01/31/22 2053    oxyCODONE (ROXICODONE) 5 MG immediate release tablet  Every 6 hours PRN        01/31/22 2053              Terald Sleeper, MD 01/31/22 2057

## 2022-01-31 NOTE — ED Triage Notes (Signed)
Patient presents to ED via POV from home. Patient reports left sided abdominal pain that began 3 days ago. Reports nausea and diarrhea. Denies vomiting.

## 2022-05-16 ENCOUNTER — Other Ambulatory Visit: Payer: Self-pay | Admitting: Gastroenterology

## 2022-05-16 DIAGNOSIS — K5090 Crohn's disease, unspecified, without complications: Secondary | ICD-10-CM

## 2022-06-20 ENCOUNTER — Other Ambulatory Visit: Payer: Commercial Managed Care - HMO

## 2022-07-23 ENCOUNTER — Other Ambulatory Visit: Payer: Commercial Managed Care - HMO

## 2022-10-06 ENCOUNTER — Inpatient Hospital Stay (HOSPITAL_COMMUNITY): Payer: Commercial Managed Care - HMO

## 2022-10-06 ENCOUNTER — Encounter (HOSPITAL_COMMUNITY): Payer: Self-pay | Admitting: Obstetrics & Gynecology

## 2022-10-06 ENCOUNTER — Inpatient Hospital Stay (HOSPITAL_COMMUNITY)
Admission: AD | Admit: 2022-10-06 | Discharge: 2022-10-06 | Disposition: A | Discharge status: Home / Self Care | Payer: Commercial Managed Care - HMO | Attending: Obstetrics & Gynecology | Admitting: Obstetrics & Gynecology

## 2022-10-06 DIAGNOSIS — O209 Hemorrhage in early pregnancy, unspecified: Secondary | ICD-10-CM | POA: Insufficient documentation

## 2022-10-06 DIAGNOSIS — Z3A10 10 weeks gestation of pregnancy: Secondary | ICD-10-CM | POA: Insufficient documentation

## 2022-10-06 LAB — HCG, QUANTITATIVE, PREGNANCY: hCG, Beta Chain, Quant, S: 74332 m[IU]/mL — ABNORMAL HIGH (ref <5)

## 2022-10-06 LAB — URINALYSIS, ROUTINE W REFLEX MICROSCOPIC
Bacteria, UA: NONE SEEN
Bilirubin Urine: NEGATIVE
Glucose, UA: NEGATIVE mg/dL
Hgb urine dipstick: NEGATIVE
Ketones, ur: NEGATIVE mg/dL
Leukocytes,Ua: NEGATIVE
Nitrite: NEGATIVE
Protein, ur: 30 mg/dL — AB
Specific Gravity, Urine: 1.033 — ABNORMAL HIGH (ref 1.005–1.030)
pH: 6 (ref 5.0–8.0)

## 2022-10-06 LAB — CBC
HCT: 35.8 % — ABNORMAL LOW (ref 36.0–46.0)
Hemoglobin: 11 g/dL — ABNORMAL LOW (ref 12.0–15.0)
MCH: 23.4 pg — ABNORMAL LOW (ref 26.0–34.0)
MCHC: 30.7 g/dL (ref 30.0–36.0)
MCV: 76 fL — ABNORMAL LOW (ref 80.0–100.0)
Platelets: 333 10*3/uL (ref 150–400)
RBC: 4.71 MIL/uL (ref 3.87–5.11)
RDW: 15.6 % — ABNORMAL HIGH (ref 11.5–15.5)
WBC: 6.9 10*3/uL (ref 4.0–10.5)
nRBC: 0 % (ref 0.0–0.2)

## 2022-10-06 LAB — OB RESULTS CONSOLE ABO/RH: RH Type: POSITIVE

## 2022-10-06 LAB — WET PREP, GENITAL
Sperm: NONE SEEN
Trich, Wet Prep: NONE SEEN
WBC, Wet Prep HPF POC: <10 (ref <10)
Yeast Wet Prep HPF POC: NONE SEEN

## 2022-10-06 NOTE — Discharge Instructions (Signed)

## 2022-10-06 NOTE — MAU Note (Signed)
Pt say at 0100- went to b-room. Saw blood in toilet- continued x15 min- then occ bleeding throughout day .  Now only sees red blood when she wipes - sometime. Last U/S was good.  No cramping . Feels some pressure on left side - started last night with VB Last sex- 3 weeks ago  [place] in [place].

## 2022-10-06 NOTE — MAU Provider Note (Signed)
History     CSN: [id number]  Arrival date and time: 10/06/22 1914   None     Chief Complaint  Patient presents with   Vaginal Bleeding   HPI Caitlin Anthony is a 22 y.o. G1P0 at 58w1dwho presents to MAU for vaginal bleeding. She reports she went to the bathroom this morning around 0100 and saw blood in the toilet. She reports there were also a few quarter-sized clots. Soon after the bleeding started she noticed some pain and pressure in her left side which now just feels like a discomfort. Bleeding has also since slowed and is only when she wipes. She denies urinary s/s, vaginal itching or odor. No recent intercourse or pelvic exams. LMP was 12/24.  [facility]  OB History     Gravida  1   Para      Term      Preterm      AB      Living         SAB      IAB      Ectopic      Multiple      Live Births              Past Medical History:  Diagnosis Date   Asthma    Crohn's disease ([place]     No past surgical history on file.  Family History  Problem Relation Age of Onset   Asthma Other    Diabetes Other     Social History   Tobacco Use   Smoking status: Never   Smokeless tobacco: Never  Substance Use Topics   Alcohol use: Never   Drug use: Never    Allergies:  Allergies  Allergen Reactions   Pineapple Swelling    Also hives    Medications Prior to Admission  Medication Sig Dispense Refill Last Dose   acetaminophen (TYLENOL) 325 MG tablet Take 2 tablets (650 mg total) by mouth every 6 (six) hours as needed for up to 30 doses. 30 tablet 0    albuterol (VENTOLIN HFA) 108 (90 Base) MCG/ACT inhaler Inhale 2 puffs into the lungs every 2 (two) hours as needed for wheezing or shortness of breath (cough). 8 g 0    benzonatate (TESSALON PERLES) 100 MG capsule Take 1 capsule (100 mg total) by mouth 3 (three) times daily as needed for cough (cough). 20 capsule 0    buPROPion (WELLBUTRIN XL) 300  MG 24 hr tablet Take 300 mg by mouth daily.      busPIRone (BUSPAR) 10 MG tablet Take 10 mg by mouth 3 (three) times daily.      fluticasone (FLONASE) 50 MCG/ACT nasal spray Place 2 sprays into both nostrils daily. 9.9 g 0    ibuprofen (ADVIL) 600 MG tablet Take 1 tablet (600 mg total) by mouth every 8 (eight) hours as needed for up to 21 doses for mild pain or headache. 21 tablet 0    naproxen (NAPROSYN) 250 MG tablet Take 2 tablets (500 mg total) by mouth 2 (two) times daily as needed for mild pain. 30 tablet 0    oxyCODONE (ROXICODONE) 5 MG immediate release tablet Take 1 tablet (5 mg total) by mouth every 6 (six) hours as needed for up to 5 doses for severe pain or breakthrough pain. 5 tablet 0    phentermine (ADIPEX-P) 37.5 MG tablet Take 37.5 mg by mouth daily.  Vitamin D, Ergocalciferol, (DRISDOL) 1.25 MG (50000 UNIT) CAPS capsule Take 50,000 Units by mouth every Monday.      Review of Systems  Constitutional: Negative.   Gastrointestinal:  Positive for abdominal pain.  Genitourinary:  Positive for vaginal bleeding.  All other systems reviewed and are negative.  Physical Exam  Patient Vitals for the past 24 hrs:  BP Temp Temp src Pulse Resp Height Weight  10/06/22 2117 128/66 -- -- -- -- -- --  10/06/22 1947 124/67 98.5 F (36.9 C) Oral 88 16 '5\' 3"'$  (1.6 m) (!) 155.5 kg   Physical Exam Vitals and nursing note reviewed.  Constitutional:      General: She is not in acute distress.    Appearance: She is obese.  Eyes:     Extraocular Movements: Extraocular movements intact.     Pupils: Pupils are equal, round, and reactive to light.  Cardiovascular:     Rate and Rhythm: Normal rate.  Pulmonary:     Effort: Pulmonary effort is normal. No respiratory distress.  Genitourinary:    Comments: Patient self swabbed Musculoskeletal:        General: Normal range of motion.     Cervical back: Normal range of motion.  Skin:    General: Skin is warm and dry.  Neurological:      General: No focal deficit present.     Mental Status: She is alert and oriented to person, place, and time.  Psychiatric:        Mood and Affect: Mood normal.        Behavior: Behavior normal.    Results for orders placed or performed during the hospital encounter of 10/06/22 (from the past 24 hour(s))  Urinalysis, Routine w reflex microscopic -Urine, Clean Catch     Status: Abnormal   Collection Time: 10/06/22  7:55 PM  Result Value Ref Range   Color, Urine YELLOW YELLOW   APPearance HAZY (A) CLEAR   Specific Gravity, Urine 1.033 (H) 1.005 - 1.030   pH 6.0 5.0 - 8.0   Glucose, UA NEGATIVE NEGATIVE mg/dL   Hgb urine dipstick NEGATIVE NEGATIVE   Bilirubin Urine NEGATIVE NEGATIVE   Ketones, ur NEGATIVE NEGATIVE mg/dL   Protein, ur 30 (A) NEGATIVE mg/dL   Nitrite NEGATIVE NEGATIVE   Leukocytes,Ua NEGATIVE NEGATIVE   RBC / HPF 0-5 0 - 5 RBC/hpf   WBC, UA 0-5 0 - 5 WBC/hpf   Bacteria, UA NONE SEEN NONE SEEN   Squamous Epithelial / HPF 0-5 0 - 5 /HPF   Mucus PRESENT    Ca Oxalate Crys, UA PRESENT   Wet prep, genital     Status: Abnormal   Collection Time: 10/06/22  7:55 PM  Result Value Ref Range   Yeast Wet Prep HPF POC NONE SEEN NONE SEEN   Trich, Wet Prep NONE SEEN NONE SEEN   Clue Cells Wet Prep HPF POC PRESENT (A) NONE SEEN   WBC, Wet Prep HPF POC <10 <10   Sperm NONE SEEN   OB RESULTS CONSOLE ABO/Rh     Status: None   Collection Time: 10/06/22  8:05 PM  Result Value Ref Range   RH Type  Positive    ABO Grouping O   CBC     Status: Abnormal   Collection Time: 10/06/22  8:42 PM  Result Value Ref Range   WBC 6.9 4.0 - 10.5 K/uL   RBC 4.71 3.87 - 5.11 MIL/uL   Hemoglobin 11.0 (L) 12.0 - 15.0 g/dL  HCT 35.8 (L) 36.0 - 46.0 %   MCV 76.0 (L) 80.0 - 100.0 fL   MCH 23.4 (L) 26.0 - 34.0 pg   MCHC 30.7 30.0 - 36.0 g/dL   RDW 15.6 (H) 11.5 - 15.5 %   Platelets 333 150 - 400 K/uL   nRBC 0.0 0.0 - 0.2 %   US OB Comp Less 14 Wks  Result Date: 10/06/2022 CLINICAL DATA:   Vaginal bleeding EXAM: OBSTETRIC <14 WK ULTRASOUND TECHNIQUE: Transabdominal ultrasound was performed for evaluation of the gestation as well as the maternal uterus and adnexal regions. COMPARISON:  None Available. FINDINGS: Intrauterine gestational sac: Single Yolk sac:  Visualized. Embryo:  Visualized. Cardiac Activity: Visualized. Heart Rate: 173 bpm MSD:    mm    w     d CRL:   43.3 mm   11 w 1 d                  [place] EDC: 04/26/2023 Subchorionic hemorrhage:  None visualized. Maternal uterus/adnexae: No adnexal mass or free fluid. IMPRESSION: Eleven week 1 day intrauterine pregnancy. Fetal heart rate 173 beats per minute. No acute maternal findings. Electronically Signed   By: Rolm Baptise M.D.   On: 10/06/2022 21:02    MAU Course  Procedures  MDM UA Wet prep, GC/CT CBC, HCG [place]  UA, wet prep, and CBC normal. GC/CT pending. Blood type O positive per Epic review- Rhogam not indicated US shows [place] with FHR present  Assessment and Plan   1. [redacted] weeks gestation of pregnancy   2. Vaginal bleeding affecting early pregnancy    - Discharge home in stable condition - Pelvic rest. Return precautions given - Keep OB appointment as scheduled on 3/6 - Return to MAU as needed for new/worsening symptoms  Renee Harder, CNM 10/06/2022, 9:35 PM

## 2022-10-07 LAB — GC/CHLAMYDIA PROBE AMP (~~LOC~~) NOT AT ARMC
Chlamydia: NEGATIVE
Comment: NEGATIVE
Comment: NORMAL
Neisseria Gonorrhea: NEGATIVE

## 2022-11-11 ENCOUNTER — Other Ambulatory Visit: Payer: Self-pay

## 2022-11-11 ENCOUNTER — Emergency Department (HOSPITAL_COMMUNITY)
Admission: EM | Admit: 2022-11-11 | Discharge: 2022-11-11 | Disposition: A | Discharge status: Home / Self Care | Payer: Medicaid Other | Attending: Emergency Medicine | Admitting: Emergency Medicine

## 2022-11-11 ENCOUNTER — Encounter (HOSPITAL_COMMUNITY): Payer: Self-pay

## 2022-11-11 ENCOUNTER — Emergency Department (HOSPITAL_COMMUNITY): Payer: Medicaid Other

## 2022-11-11 DIAGNOSIS — O99891 Other specified diseases and conditions complicating pregnancy: Secondary | ICD-10-CM | POA: Insufficient documentation

## 2022-11-11 DIAGNOSIS — R110 Nausea: Secondary | ICD-10-CM | POA: Insufficient documentation

## 2022-11-11 DIAGNOSIS — Y92410 Unspecified street and highway as the place of occurrence of the external cause: Secondary | ICD-10-CM | POA: Insufficient documentation

## 2022-11-11 MED ORDER — PROMETHAZINE HCL 25 MG PO TABS
25.0000 mg | ORAL_TABLET | Freq: Once | ORAL | Status: AC
  Administered 2022-11-11: 25 mg via ORAL
  Filled 2022-11-11: qty 1

## 2022-11-11 MED ORDER — ACETAMINOPHEN 500 MG PO TABS
1000.0000 mg | ORAL_TABLET | Freq: Once | ORAL | Status: AC
Start: 2022-11-11 — End: 2022-11-11
  Administered 2022-11-11: 1000 mg via ORAL
  Filled 2022-11-11: qty 2

## 2022-11-11 NOTE — ED Notes (Signed)
Patient verbalizes understanding of discharge instructions. Opportunity for questioning and answers were provided. Armband removed by staff, pt discharged from ED. Pt taken to ED waiting room via wheel chair.  
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
## 2022-11-21 ENCOUNTER — Encounter (HOSPITAL_BASED_OUTPATIENT_CLINIC_OR_DEPARTMENT_OTHER): Payer: Self-pay

## 2022-11-21 ENCOUNTER — Other Ambulatory Visit: Payer: Self-pay

## 2022-11-21 ENCOUNTER — Emergency Department (HOSPITAL_BASED_OUTPATIENT_CLINIC_OR_DEPARTMENT_OTHER)
Admission: EM | Admit: 2022-11-21 | Discharge: 2022-11-22 | Disposition: A | Discharge status: Home / Self Care | Payer: Medicaid Other | Attending: Emergency Medicine | Admitting: Emergency Medicine

## 2022-11-21 DIAGNOSIS — L02412 Cutaneous abscess of left axilla: Secondary | ICD-10-CM | POA: Insufficient documentation

## 2022-11-21 DIAGNOSIS — O26892 Other specified pregnancy related conditions, second trimester: Secondary | ICD-10-CM | POA: Diagnosis present

## 2022-11-21 DIAGNOSIS — O99712 Diseases of the skin and subcutaneous tissue complicating pregnancy, second trimester: Secondary | ICD-10-CM | POA: Diagnosis not present

## 2022-11-21 DIAGNOSIS — Z3A17 17 weeks gestation of pregnancy: Secondary | ICD-10-CM | POA: Insufficient documentation

## 2022-11-21 NOTE — ED Triage Notes (Addendum)
Left axillary cyst ruptured today. Lots of drainage.   4 months pregnant.

## 2022-11-22 MED ORDER — CLINDAMYCIN HCL 300 MG PO CAPS: 300.0000 mg | ORAL_CAPSULE | Freq: Three times a day (TID) | ORAL | 0 refills | Status: DC

## 2022-11-22 NOTE — ED Notes (Signed)
Reviewed AVS/discharge instruction with patient. Time allotted for and all questions answered. Patient is agreeable for d/c and escorted to ed exit by staff.  

## 2022-11-22 NOTE — ED Provider Notes (Signed)
[place] EMERGENCY DEPARTMENT AT [facility] Provider Note   CSN: [id number] Arrival date & time: 11/21/22  2114     History  Chief Complaint  Patient presents with   Cyst    Caitlin Anthony is a 22 y.o. female.  The history is provided by the patient.  Patient is a G1, P0 at approximately 17 weeks presents with abscess to her left axilla.  Patient has a history of inflammatory bowel disease that is well-controlled.  She denies any complications from her pregnancy.  No recent abdominal pain, no contractions, no vaginal bleeding.  She reports recent pain swelling in her left axilla.  She has had this previously.  She reports that the area is now draining. Denies any previous history of surgery for abscesses.  No recent antibiotics.     Home Medications Prior to Admission medications   Medication Sig Start Date End Date Taking? Authorizing Provider  clindamycin (CLEOCIN) 300 MG capsule Take 1 capsule (300 mg total) by mouth 3 (three) times daily. X 7 days 11/22/22  Yes Zadie Rhine, MD  acetaminophen (TYLENOL) 325 MG tablet Take 2 tablets (650 mg total) by mouth every 6 (six) hours as needed for up to 30 doses. Patient not taking: Reported on 11/11/2022 01/31/22   Terald Sleeper, MD  albuterol (VENTOLIN HFA) 108 (90 Base) MCG/ACT inhaler Inhale 2 puffs into the lungs every 2 (two) hours as needed for wheezing or shortness of breath (cough). Patient not taking: Reported on 11/11/2022 05/26/20   Muthersbaugh, Dahlia Client, PA-C  buPROPion (WELLBUTRIN XL) 300 MG 24 hr tablet Take 300 mg by mouth daily.    [provider]  busPIRone (BUSPAR) 10 MG tablet Take 10 mg by mouth 3 (three) times daily.    [provider]  Vitamin D, Ergocalciferol, (DRISDOL) 1.25 MG (50000 UNIT) CAPS capsule Take 50,000 Units by mouth every Monday. Patient not taking: Reported on 11/11/2022 09/19/19   [provider]      Allergies    Hydrocodone and Pineapple    Review of  Systems   Review of Systems  Constitutional:  Negative for fever.  Skin:  Positive for wound.    Physical Exam Updated Vital Signs BP (!) 141/116 (BP Location: Right Arm)   Pulse (!) 107   Temp 98.1 F (36.7 C)   Resp 17   Ht 1.6 m ( )   Wt (!) 145.2 kg   LMP 07/26/2022   SpO2 100%   BMI 56.69 kg/m  Fetal heart tones appropriate in the 140s per nursing Physical Exam CONSTITUTIONAL: Well developed/well nourished HEAD: Normocephalic/atraumatic ABDOMEN: soft, obese NEURO: Pt is awake/alert/appropriate, moves all extremitiesx4.  No facial droop.   EXTREMITIES: pulses normal/equal, full ROM SKIN: warm, color normal Actively draining abscess to the left axilla.  No fluctuance or crepitus.  No significant erythema.  No bleeding PSYCH: no abnormalities of mood noted, alert and oriented to situation    ED Results / Procedures / Treatments   Labs (all labs ordered are listed, but only abnormal results are displayed) Labs Reviewed - No data to display  EKG None  Radiology No results found.  Procedures Procedures    Medications Ordered in ED Medications - No data to display  ED Course/ Medical Decision Making/ A&P                             Medical Decision Making Risk Prescription drug management.   Patient  with an axillary abscess that is actively draining.  Will start oral antibiotics to help for any further worsening.  We discussed wound care, we discussed return precautions.        Final Clinical Impression(s) / ED Diagnoses Final diagnoses:  Abscess of axilla, left    Rx / DC Orders ED Discharge Orders          Ordered    clindamycin (CLEOCIN) 300 MG capsule  3 times daily        11/22/22 0021              Zadie Rhine, MD 11/22/22 0100

## 2022-12-05 ENCOUNTER — Encounter (HOSPITAL_BASED_OUTPATIENT_CLINIC_OR_DEPARTMENT_OTHER): Payer: Self-pay

## 2022-12-05 ENCOUNTER — Other Ambulatory Visit: Payer: Self-pay

## 2022-12-05 ENCOUNTER — Emergency Department (HOSPITAL_BASED_OUTPATIENT_CLINIC_OR_DEPARTMENT_OTHER)
Admission: EM | Admit: 2022-12-05 | Discharge: 2022-12-06 | Disposition: A | Discharge status: Home / Self Care | Payer: Medicaid Other | Attending: Emergency Medicine | Admitting: Emergency Medicine

## 2022-12-05 DIAGNOSIS — Z5189 Encounter for other specified aftercare: Secondary | ICD-10-CM

## 2022-12-05 DIAGNOSIS — J45909 Unspecified asthma, uncomplicated: Secondary | ICD-10-CM | POA: Insufficient documentation

## 2022-12-05 DIAGNOSIS — Z4800 Encounter for change or removal of nonsurgical wound dressing: Secondary | ICD-10-CM | POA: Insufficient documentation

## 2022-12-05 MED ORDER — BACITRACIN ZINC 500 UNIT/GM EX OINT: 1.0000 | TOPICAL_OINTMENT | Freq: Two times a day (BID) | CUTANEOUS | 0 refills | Status: DC

## 2022-12-05 NOTE — ED Provider Notes (Signed)
  [place] EMERGENCY DEPARTMENT AT [facility] Provider Note   CSN: [id number] Arrival date & time: 12/05/22  2147     History {Add pertinent medical, surgical, social history, OB history to HPI:1} Chief Complaint  Patient presents with   Abscess    Caitlin Anthony is a 22 y.o. female.  HPI     Home Medications Prior to Admission medications   Medication Sig Start Date End Date Taking? Authorizing Provider  bacitracin ointment Apply 1 Application topically 2 (two) times daily. 12/05/22  Yes Jakeya Gherardi, Mayer Masker, MD  acetaminophen (TYLENOL) 325 MG tablet Take 2 tablets (650 mg total) by mouth every 6 (six) hours as needed for up to 30 doses. Patient not taking: Reported on 11/11/2022 01/31/22   Terald Sleeper, MD  albuterol (VENTOLIN HFA) 108 (90 Base) MCG/ACT inhaler Inhale 2 puffs into the lungs every 2 (two) hours as needed for wheezing or shortness of breath (cough). Patient not taking: Reported on 11/11/2022 05/26/20   Muthersbaugh, Dahlia Client, PA-C  buPROPion (WELLBUTRIN XL) 300 MG 24 hr tablet Take 300 mg by mouth daily.    [provider]  busPIRone (BUSPAR) 10 MG tablet Take 10 mg by mouth 3 (three) times daily.    [provider]  clindamycin (CLEOCIN) 300 MG capsule Take 1 capsule (300 mg total) by mouth 3 (three) times daily. X 7 days 11/22/22   Zadie Rhine, MD  Vitamin D, Ergocalciferol, (DRISDOL) 1.25 MG (50000 UNIT) CAPS capsule Take 50,000 Units by mouth every Monday. Patient not taking: Reported on 11/11/2022 09/19/19   [provider]      Allergies    Hydrocodone and Pineapple    Review of Systems   Review of Systems  Physical Exam Updated Vital Signs BP (!) 138/93 (BP Location: Right Arm)   Pulse 93   Temp 98.3 F (36.8 C)   Resp 18   Ht 1.6 m (5\' 3" )   Wt (!) 154.2 kg   LMP 07/26/2022   SpO2 100%   BMI 60.23 kg/m  Physical Exam  ED Results / Procedures / Treatments   Labs (all labs ordered are listed, but only  abnormal results are displayed) Labs Reviewed - No data to display  EKG None  Radiology No results found.  Procedures Procedures  {Document cardiac monitor, telemetry assessment procedure when appropriate:1}  Medications Ordered in ED Medications - No data to display  ED Course/ Medical Decision Making/ A&P   {   Click here for ABCD2, HEART and other calculatorsREFRESH Note before signing :1}                          Medical Decision Making Risk OTC drugs.   ***  {Document critical care time when appropriate:1} {Document review of labs and clinical decision tools ie heart score, Chads2Vasc2 etc:1}  {Document your independent review of radiology images, and any outside records:1} {Document your discussion with family members, caretakers, and with consultants:1} {Document social determinants of health affecting pt's care:1} {Document your decision making why or why not admission, treatments were needed:1} Final Clinical Impression(s) / ED Diagnoses Final diagnoses:  Wound check, abscess    Rx / DC Orders ED Discharge Orders          Ordered    bacitracin ointment  2 times daily        12/05/22 2351

## 2022-12-05 NOTE — ED Triage Notes (Signed)
Patient here POV from Home.  Endorses Left Axillary Abscess noted a few weeks ago. Seen for same then and given antibiotics. No Change with treatment and then began to have green drainage a week ago. No Fevers.   NAD Noted during Triage. A&Ox4. GCS 15. Ambulatory.

## 2022-12-05 NOTE — Discharge Instructions (Signed)
You were seen today for wound check.  The area where you previously had an abscess appears to be healing.  Apply topical antibiotic ointment.  If you note redness or increasing tenderness or drainage from the area, you should be reevaluated.

## 2023-07-22 ENCOUNTER — Emergency Department (HOSPITAL_BASED_OUTPATIENT_CLINIC_OR_DEPARTMENT_OTHER): Payer: Medicaid Other

## 2023-07-22 ENCOUNTER — Emergency Department (HOSPITAL_BASED_OUTPATIENT_CLINIC_OR_DEPARTMENT_OTHER)
Admission: EM | Admit: 2023-07-22 | Discharge: 2023-07-23 | Disposition: A | Discharge status: Home / Self Care | Payer: Medicaid Other | Attending: Emergency Medicine | Admitting: Emergency Medicine

## 2023-07-22 ENCOUNTER — Encounter (HOSPITAL_BASED_OUTPATIENT_CLINIC_OR_DEPARTMENT_OTHER): Payer: Self-pay | Admitting: Emergency Medicine

## 2023-07-22 DIAGNOSIS — K5090 Crohn's disease, unspecified, without complications: Secondary | ICD-10-CM | POA: Insufficient documentation

## 2023-07-22 DIAGNOSIS — N819 Female genital prolapse, unspecified: Secondary | ICD-10-CM | POA: Insufficient documentation

## 2023-07-22 DIAGNOSIS — R1030 Lower abdominal pain, unspecified: Secondary | ICD-10-CM | POA: Diagnosis present

## 2023-07-22 DIAGNOSIS — R112 Nausea with vomiting, unspecified: Secondary | ICD-10-CM | POA: Diagnosis not present

## 2023-07-22 DIAGNOSIS — Z7951 Long term (current) use of inhaled steroids: Secondary | ICD-10-CM | POA: Insufficient documentation

## 2023-07-22 DIAGNOSIS — J45909 Unspecified asthma, uncomplicated: Secondary | ICD-10-CM | POA: Insufficient documentation

## 2023-07-22 DIAGNOSIS — N83201 Unspecified ovarian cyst, right side: Secondary | ICD-10-CM

## 2023-07-22 LAB — URINALYSIS, ROUTINE W REFLEX MICROSCOPIC
Bilirubin Urine: NEGATIVE
Glucose, UA: NEGATIVE mg/dL
Nitrite: NEGATIVE
Protein, ur: 30 mg/dL — AB
RBC / HPF: >50 RBC/hpf (ref 0–5)
Specific Gravity, Urine: 1.036 — ABNORMAL HIGH (ref 1.005–1.030)
pH: 6 (ref 5.0–8.0)

## 2023-07-22 LAB — COMPREHENSIVE METABOLIC PANEL
ALT: 24 U/L (ref 0–44)
AST: 27 U/L (ref 15–41)
Albumin: 4 g/dL (ref 3.5–5.0)
Alkaline Phosphatase: 54 U/L (ref 38–126)
Anion gap: 9 (ref 5–15)
BUN: 9 mg/dL (ref 6–20)
CO2: 25 mmol/L (ref 22–32)
Calcium: 9 mg/dL (ref 8.9–10.3)
Chloride: 105 mmol/L (ref 98–111)
Creatinine, Ser: 0.79 mg/dL (ref 0.44–1.00)
GFR, Estimated: >60 mL/min (ref >60)
Glucose, Bld: 90 mg/dL (ref 70–99)
Potassium: 3.5 mmol/L (ref 3.5–5.1)
Sodium: 139 mmol/L (ref 135–145)
Total Bilirubin: 0.2 mg/dL (ref <1.2)
Total Protein: 7.8 g/dL (ref 6.5–8.1)

## 2023-07-22 LAB — CBC WITH DIFFERENTIAL/PLATELET
Abs Immature Granulocytes: 0 10*3/uL (ref 0.00–0.07)
Basophils Absolute: 0 10*3/uL (ref 0.0–0.1)
Basophils Relative: 0 %
Eosinophils Absolute: 0.1 10*3/uL (ref 0.0–0.5)
Eosinophils Relative: 2 %
HCT: 34.6 % — ABNORMAL LOW (ref 36.0–46.0)
Hemoglobin: 10.4 g/dL — ABNORMAL LOW (ref 12.0–15.0)
Immature Granulocytes: 0 %
Lymphocytes Relative: 42 %
Lymphs Abs: 1.5 10*3/uL (ref 0.7–4.0)
MCH: 22 pg — ABNORMAL LOW (ref 26.0–34.0)
MCHC: 30.1 g/dL (ref 30.0–36.0)
MCV: 73.2 fL — ABNORMAL LOW (ref 80.0–100.0)
Monocytes Absolute: 0.4 10*3/uL (ref 0.1–1.0)
Monocytes Relative: 11 %
Neutro Abs: 1.6 10*3/uL — ABNORMAL LOW (ref 1.7–7.7)
Neutrophils Relative %: 45 %
Platelets: 330 10*3/uL (ref 150–400)
RBC: 4.73 MIL/uL (ref 3.87–5.11)
RDW: 16.7 % — ABNORMAL HIGH (ref 11.5–15.5)
WBC: 3.6 10*3/uL — ABNORMAL LOW (ref 4.0–10.5)
nRBC: 0 % (ref 0.0–0.2)

## 2023-07-22 LAB — PREGNANCY, URINE: Preg Test, Ur: NEGATIVE

## 2023-07-22 LAB — LIPASE, BLOOD: Lipase: 20 U/L (ref 11–51)

## 2023-07-22 MED ORDER — MORPHINE SULFATE (PF) 4 MG/ML IV SOLN
4.0000 mg | Freq: Once | INTRAVENOUS | Status: AC
Start: 2023-07-23 — End: 2023-07-23
  Administered 2023-07-23: 4 mg via INTRAVENOUS
  Filled 2023-07-22: qty 1

## 2023-07-22 MED ORDER — MORPHINE SULFATE (PF) 4 MG/ML IV SOLN
4.0000 mg | Freq: Once | INTRAVENOUS | Status: AC
Start: 2023-07-22 — End: 2023-07-22
  Administered 2023-07-22: 4 mg via INTRAVENOUS
  Filled 2023-07-22: qty 1

## 2023-07-22 MED ORDER — IOHEXOL 300 MG/ML  SOLN
100.0000 mL | Freq: Once | INTRAMUSCULAR | Status: AC | PRN
  Administered 2023-07-22: 100 mL via INTRAVENOUS

## 2023-07-22 NOTE — ED Provider Notes (Signed)
[place] EMERGENCY DEPARTMENT AT [facility] Provider Note   CSN: [id number] Arrival date & time: 07/22/23  1927     History  Chief Complaint  Patient presents with   Abdominal Pain    Caitlin Anthony is a 22 y.o. female.  The history is provided by the patient and medical records. No language interpreter was used.  Abdominal Pain    22 year old female history of asthma, Crohn's disease, presenting with complaint of abdominal pain.  Patient endorsed pain across her lower back and her lower abdomen ongoing for the past 3 days.  She described as a bulging and pressure sensation to her lower back worsening with ambulation and movement.  She endorsed nausea and vomiting without any diarrhea constipation.  She mention feeling a bulge coming out from her vagina today which is new.  She recently gave birth and August of this year.  She denies any bloody diarrhea.  She denies any vaginal bleeding or vaginal discharge. Pain is moderate in severity.  Home Medications Prior to Admission medications   Medication Sig Start Date End Date Taking? Authorizing Provider  acetaminophen (TYLENOL) 325 MG tablet Take 2 tablets (650 mg total) by mouth every 6 (six) hours as needed for up to 30 doses. Patient not taking: Reported on 11/11/2022 01/31/22   Terald Sleeper, MD  albuterol (VENTOLIN HFA) 108 (90 Base) MCG/ACT inhaler Inhale 2 puffs into the lungs every 2 (two) hours as needed for wheezing or shortness of breath (cough). Patient not taking: Reported on 11/11/2022 05/26/20   Muthersbaugh, Dahlia Client, PA-C  bacitracin ointment Apply 1 Application topically 2 (two) times daily. 12/05/22   Horton, Mayer Masker, MD  buPROPion (WELLBUTRIN XL) 300 MG 24 hr tablet Take 300 mg by mouth daily.    [provider]  busPIRone (BUSPAR) 10 MG tablet Take 10 mg by mouth 3 (three) times daily.    [provider]  clindamycin (CLEOCIN) 300 MG capsule Take 1 capsule (300 mg total) by mouth 3 (three)  times daily. X 7 days 11/22/22   Zadie Rhine, MD  Vitamin D, Ergocalciferol, (DRISDOL) 1.25 MG (50000 UNIT) CAPS capsule Take 50,000 Units by mouth every Monday. Patient not taking: Reported on 11/11/2022 09/19/19   [provider]      Allergies    Hydrocodone and Pineapple    Review of Systems   Review of Systems  Gastrointestinal:  Positive for abdominal pain.  All other systems reviewed and are negative.   Physical Exam Updated Vital Signs BP (!) 132/93   Pulse 98   Temp 98.7 F (37.1 C)   Resp 18   SpO2 100%   Breastfeeding Unknown  Physical Exam Vitals and nursing note reviewed.  Constitutional:      General: She is not in acute distress.    Appearance: She is well-developed. She is obese.  HENT:     Head: Atraumatic.  Eyes:     Conjunctiva/sclera: Conjunctivae normal.  Cardiovascular:     Rate and Rhythm: Normal rate and regular rhythm.     Pulses: Normal pulses.     Heart sounds: Normal heart sounds.  Pulmonary:     Effort: Pulmonary effort is normal.  Abdominal:     Palpations: Abdomen is soft.     Tenderness: There is abdominal tenderness (Mild paramedical tenderness no guarding or rebound tenderness.  Negative Murphy sign, no pain at McBurney's point.). There is no right CVA tenderness or left CVA tenderness.  Musculoskeletal:     Cervical  back: Neck supple.  Skin:    Findings: No rash.  Neurological:     Mental Status: She is alert.  Psychiatric:        Mood and Affect: Mood normal.     ED Results / Procedures / Treatments   Labs (all labs ordered are listed, but only abnormal results are displayed) Labs Reviewed - No data to display  EKG None  Radiology No results found.  Procedures .Pelvic exam  Date/Time: 07/22/2023 9:01 PM  Performed by: Fayrene Helper, PA-C Authorized by: Fayrene Helper, PA-C  Comments: Chaperone present during exam.  No inguinal lymphadenopathy or inguinal hernia noted.  Normal external genitalia.  Significant  discomfort with speculum insertion.  There is a protuberance noted in the vaginal vault, possibly uterine prolapse versus vesiculocele versus rectocele.  I am unable to fully examine the vaginal vault due to large body habitus and patient discomfort.  No vaginal discharge noted.  Blood were noted in vaginal vault.       Medications Ordered in ED Medications - No data to display  ED Course/ Medical Decision Making/ A&P                                 Medical Decision Making Amount and/or Complexity of Data Reviewed Labs: ordered. Radiology: ordered.  Risk Prescription drug management.   BP (!) 132/93   Pulse 98   Temp 98.7 F (37.1 C)   Resp 18   SpO2 100%   Breastfeeding Unknown   60:35 PM   22 year old female history of asthma, Crohn's disease, presenting with complaint of abdominal pain.  Patient endorsed pain across her lower back and her lower abdomen ongoing for the past 3 days.  She described as a bulging and pressure sensation to her lower back worsening with ambulation and movement.  She endorsed nausea and vomiting without any diarrhea constipation.  She mention feeling a bulge coming out from her vagina today which is new.  She recently gave birth and August of this year.  She denies any bloody diarrhea.  She denies any vaginal bleeding or vaginal discharge. Pain is moderate in severity.  On exam patient has periumbilical abdominal tenderness without guarding or rebound tenderness.  -Labs ordered, independently viewed and interpreted by me.  Labs remarkable for Hgb 10.4. UA with large Hgb but this is likely from current menstruation.  -The patient was maintained on a cardiac monitor.  I personally viewed and interpreted the cardiac monitored which showed an underlying rhythm of: NSR -Imaging including abd/pelvis CT ordered, result pending.  -This patient presents to the ED for concern of abd pain, this involves an extensive number of treatment options, and is a complaint  that carries with it a high risk of complications and morbidity.  The differential diagnosis includes appendicitis, colitis, diverticulitis, crohn's flare, TOA, ectopic, PID, UTI -Co morbidities that complicate the patient evaluation includes obesity, Crohn's disease, asthma -Treatment includes morphine -Reevaluation of the patient after these medicines showed that the patient improved -PCP office notes or outside notes reviewed -Discussion with oncoming team who will f/u on CT result, and reassess pt -Escalation to admission/observation considered: dispo pending  Pt does have some form of vaginal prolapse that I am unable to differentiate.  Recommend outpt f/u with OBGYN.           Final Clinical Impression(s) / ED Diagnoses Final diagnoses:  Female genital prolapse, unspecified type  Rx / DC Orders ED Discharge Orders     None         Fayrene Helper, Cordelia Poche 07/22/23 2134    Vanetta Mulders, MD 07/24/23 2137

## 2023-07-22 NOTE — ED Notes (Signed)
Cramping pain worsens after [place]. Hot packs given. Meds ordered-see MAR.

## 2023-07-22 NOTE — Discharge Instructions (Signed)
Please follow-up with the OB/GYN attached here in regards recent ER visit to discuss the prolapse along with a right ovarian cyst.  Take Tylenol every 6 hours needed for pain.  If symptoms change or worsen please return to the ER.

## 2023-07-22 NOTE — ED Notes (Signed)
Out to [place]

## 2023-07-22 NOTE — ED Provider Notes (Signed)
Patient given in sign out by Ardelle Park.  Please review their note for patient HPI, physical exam, workup.  At this time the plan is follow-up on CT scan and if negative will discharge with OB/GYN follow-up for her vaginal prolapse.  CT shows 5.6 cm right ovarian cyst and recommends ultrasound in the setting of lower abdominal pain with Doppler to rule out torsion.  I spoke to the patient about this and patient would like to proceed with ultrasound so this was ordered.  Patient also notes that she did have an ovarian cyst there but thought it was smaller however is unsure of the previous size.  Patient signed out to Ross Marcus, MD.  Please review their note for the continuation of patient's care.  The plan at this point is follow-up on ultrasound and if negative discharge with OB/GYN follow-up.   Netta Corrigan, PA-C 07/22/23 2356    Shon Baton, MD 07/23/23 [phone]

## 2023-07-22 NOTE — ED Triage Notes (Signed)
Lower abdo pain. X3 day Feels "buldging pressure" at vaginal opening today. Some nausea  Had baby 8/0/2024

## 2023-07-23 MED ORDER — NAPROXEN 500 MG PO TABS: 500.0000 mg | ORAL_TABLET | Freq: Two times a day (BID) | ORAL | 0 refills | Status: DC

## 2023-09-01 ENCOUNTER — Other Ambulatory Visit: Payer: Self-pay

## 2023-09-01 ENCOUNTER — Encounter (HOSPITAL_BASED_OUTPATIENT_CLINIC_OR_DEPARTMENT_OTHER): Payer: Self-pay

## 2023-09-01 ENCOUNTER — Emergency Department (HOSPITAL_BASED_OUTPATIENT_CLINIC_OR_DEPARTMENT_OTHER)
Admission: EM | Admit: 2023-09-01 | Discharge: 2023-09-01 | Disposition: A | Discharge status: Home / Self Care | Payer: Medicaid Other | Attending: Emergency Medicine | Admitting: Emergency Medicine

## 2023-09-01 ENCOUNTER — Emergency Department (HOSPITAL_BASED_OUTPATIENT_CLINIC_OR_DEPARTMENT_OTHER): Payer: Medicaid Other | Admitting: Radiology

## 2023-09-01 DIAGNOSIS — J40 Bronchitis, not specified as acute or chronic: Secondary | ICD-10-CM | POA: Insufficient documentation

## 2023-09-01 DIAGNOSIS — Z20822 Contact with and (suspected) exposure to covid-19: Secondary | ICD-10-CM | POA: Diagnosis not present

## 2023-09-01 DIAGNOSIS — R059 Cough, unspecified: Secondary | ICD-10-CM | POA: Diagnosis present

## 2023-09-01 LAB — RESP PANEL BY RT-PCR (RSV, FLU A&B, COVID)  RVPGX2
Influenza A by PCR: NEGATIVE
Influenza B by PCR: NEGATIVE
Resp Syncytial Virus by PCR: NEGATIVE
SARS Coronavirus 2 by RT PCR: NEGATIVE

## 2023-09-01 MED ORDER — ALBUTEROL SULFATE HFA 108 (90 BASE) MCG/ACT IN AERS
2.0000 | INHALATION_SPRAY | RESPIRATORY_TRACT | 0 refills | Status: AC | PRN
Start: 2023-09-01 — End: ?

## 2023-09-01 MED ORDER — PROMETHAZINE-DM 6.25-15 MG/5ML PO SYRP: 5.0000 mL | ORAL_SOLUTION | Freq: Four times a day (QID) | ORAL | 0 refills | Status: DC | PRN

## 2023-09-01 MED ORDER — PREDNISONE 10 MG (21) PO TBPK: ORAL_TABLET | ORAL | 0 refills | Status: DC

## 2023-09-01 NOTE — ED Provider Notes (Signed)
[place] EMERGENCY DEPARTMENT AT [facility]  Provider Note  CSN: [id number] Arrival date & time: 09/01/23 0149  History Chief Complaint  Patient presents with   Cough   Nasal Congestion    Caitlin Anthony is a 23 y.o. female with history of crohn's not currently on any active therapy reports about 2 weeks of persistent productive cough, chest soreness, back pains and subjective fever. Was feeling a little SOB tonight prompting her to come to the ED for evaluation.    Home Medications Prior to Admission medications   Medication Sig Start Date End Date Taking? Authorizing Provider  predniSONE (STERAPRED UNI-PAK 21 TAB) 10 MG (21) TBPK tablet 10mg  Tabs, 6 day taper. Use as directed 09/01/23  Yes Pollyann Savoy, MD  promethazine-dextromethorphan (PROMETHAZINE-DM) 6.25-15 MG/5ML syrup Take 5 mLs by mouth 4 (four) times daily as needed for cough. 09/01/23  Yes Pollyann Savoy, MD  acetaminophen (TYLENOL) 325 MG tablet Take 2 tablets (650 mg total) by mouth every 6 (six) hours as needed for up to 30 doses. Patient not taking: Reported on 11/11/2022 01/31/22   Terald Sleeper, MD  albuterol (VENTOLIN HFA) 108 (90 Base) MCG/ACT inhaler Inhale 2 puffs into the lungs every 4 (four) hours as needed for wheezing or shortness of breath (cough). 09/01/23   Pollyann Savoy, MD  bacitracin ointment Apply 1 Application topically 2 (two) times daily. 12/05/22   Horton, Mayer Masker, MD  buPROPion (WELLBUTRIN XL) 300 MG 24 hr tablet Take 300 mg by mouth daily.    [provider]  busPIRone (BUSPAR) 10 MG tablet Take 10 mg by mouth 3 (three) times daily.    [provider]  clindamycin (CLEOCIN) 300 MG capsule Take 1 capsule (300 mg total) by mouth 3 (three) times daily. X 7 days 11/22/22   Zadie Rhine, MD  naproxen (NAPROSYN) 500 MG tablet Take 1 tablet (500 mg total) by mouth 2 (two) times daily. 07/23/23   Horton, Mayer Masker, MD  Vitamin D, Ergocalciferol, (DRISDOL) 1.25  MG (50000 UNIT) CAPS capsule Take 50,000 Units by mouth every Monday. Patient not taking: Reported on 11/11/2022 09/19/19   [provider]     Allergies    Hydrocodone and Pineapple   Review of Systems   Review of Systems Please see HPI for pertinent positives and negatives  Physical Exam BP (!) 142/88 (BP Location: Right Arm)   Pulse 98   Temp 98.6 F (37 C) (Oral)   Resp 20   Ht 5\' 4"  (1.626 m)   Wt (!) 154.2 kg   LMP 08/18/2023 (Approximate)   SpO2 98%   BMI 58.36 kg/m   Physical Exam Vitals and nursing note reviewed.  Constitutional:      Appearance: Normal appearance.  HENT:     Head: Normocephalic and atraumatic.     Nose: Nose normal.     Mouth/Throat:     Mouth: Mucous membranes are moist.  Eyes:     Extraocular Movements: Extraocular movements intact.     Conjunctiva/sclera: Conjunctivae normal.  Cardiovascular:     Rate and Rhythm: Normal rate.  Pulmonary:     Effort: Pulmonary effort is normal.     Breath sounds: Normal breath sounds. No wheezing, rhonchi or rales.     Comments: Harsh cough Abdominal:     General: Abdomen is flat.     Palpations: Abdomen is soft.     Tenderness: There is no abdominal tenderness.  Musculoskeletal:  General: No swelling. Normal range of motion.     Cervical back: Neck supple.  Skin:    General: Skin is warm and dry.  Neurological:     General: No focal deficit present.     Mental Status: She is alert.  Psychiatric:        Mood and Affect: Mood normal.     ED Results / Procedures / Treatments   EKG None  Procedures Procedures  Medications Ordered in the ED Medications - No data to display  Initial Impression and Plan  Patient here with persistent cough after recent viral illness. Suspect progression to PNA vs post-viral syndrome/bronchitis. Covid/Flu/RSV swab is neg. Will send for CXR and re-evaluate.   ED Course   Clinical Course as of 09/01/23 0401  Tue Sep 01, 2023  0400 I  personally viewed the images from radiology studies and agree with radiologist interpretation: CXR is clear. Plan discharge with Rx for prednisone, inhaler and cough syrup for bronchitis. PCP follow up, RTED for any other concerns.   [CS]    Clinical Course User Index [CS] Pollyann Savoy, MD     MDM Rules/Calculators/A&P Medical Decision Making Problems Addressed: Bronchitis: acute illness or injury  Amount and/or Complexity of Data Reviewed Labs: ordered. Decision-making details documented in ED Course. Radiology: ordered and independent interpretation performed. Decision-making details documented in ED Course.  Risk Prescription drug management.     Final Clinical Impression(s) / ED Diagnoses Final diagnoses:  Bronchitis    Rx / DC Orders ED Discharge Orders          Ordered    albuterol (VENTOLIN HFA) 108 (90 Base) MCG/ACT inhaler  Every 4 hours PRN        09/01/23 0401    predniSONE (STERAPRED UNI-PAK 21 TAB) 10 MG (21) TBPK tablet        09/01/23 0401    promethazine-dextromethorphan (PROMETHAZINE-DM) 6.25-15 MG/5ML syrup  4 times daily PRN        09/01/23 0401             Pollyann Savoy, MD 09/01/23 0401

## 2023-09-01 NOTE — ED Triage Notes (Addendum)
Pt reports cough, congestion and lower back pain over the last four days. Pt reports mild symptoms a week prior.

## 2023-09-06 ENCOUNTER — Emergency Department (HOSPITAL_BASED_OUTPATIENT_CLINIC_OR_DEPARTMENT_OTHER): Payer: Medicaid Other | Admitting: Radiology

## 2023-09-06 ENCOUNTER — Other Ambulatory Visit: Payer: Self-pay

## 2023-09-06 ENCOUNTER — Emergency Department (HOSPITAL_BASED_OUTPATIENT_CLINIC_OR_DEPARTMENT_OTHER): Payer: Medicaid Other

## 2023-09-06 ENCOUNTER — Encounter (HOSPITAL_BASED_OUTPATIENT_CLINIC_OR_DEPARTMENT_OTHER): Payer: Self-pay

## 2023-09-06 ENCOUNTER — Emergency Department (HOSPITAL_BASED_OUTPATIENT_CLINIC_OR_DEPARTMENT_OTHER)
Admission: EM | Admit: 2023-09-06 | Discharge: 2023-09-06 | Disposition: A | Discharge status: Home / Self Care | Payer: Medicaid Other | Attending: Emergency Medicine | Admitting: Emergency Medicine

## 2023-09-06 DIAGNOSIS — R0781 Pleurodynia: Secondary | ICD-10-CM | POA: Diagnosis present

## 2023-09-06 DIAGNOSIS — R0789 Other chest pain: Secondary | ICD-10-CM

## 2023-09-06 LAB — CBC WITH DIFFERENTIAL/PLATELET
Abs Immature Granulocytes: 0.02 10*3/uL (ref 0.00–0.07)
Basophils Absolute: 0 10*3/uL (ref 0.0–0.1)
Basophils Relative: 0 %
Eosinophils Absolute: 0.1 10*3/uL (ref 0.0–0.5)
Eosinophils Relative: 2 %
HCT: 36.1 % (ref 36.0–46.0)
Hemoglobin: 10.9 g/dL — ABNORMAL LOW (ref 12.0–15.0)
Immature Granulocytes: 0 %
Lymphocytes Relative: 37 %
Lymphs Abs: 2.6 10*3/uL (ref 0.7–4.0)
MCH: 21.9 pg — ABNORMAL LOW (ref 26.0–34.0)
MCHC: 30.2 g/dL (ref 30.0–36.0)
MCV: 72.6 fL — ABNORMAL LOW (ref 80.0–100.0)
Monocytes Absolute: 0.6 10*3/uL (ref 0.1–1.0)
Monocytes Relative: 8 %
Neutro Abs: 3.6 10*3/uL (ref 1.7–7.7)
Neutrophils Relative %: 53 %
Platelets: 371 10*3/uL (ref 150–400)
RBC: 4.97 MIL/uL (ref 3.87–5.11)
RDW: 18 % — ABNORMAL HIGH (ref 11.5–15.5)
WBC: 7 10*3/uL (ref 4.0–10.5)
nRBC: 0 % (ref 0.0–0.2)

## 2023-09-06 LAB — COMPREHENSIVE METABOLIC PANEL
ALT: 18 U/L (ref 0–44)
AST: 22 U/L (ref 15–41)
Albumin: 3.9 g/dL (ref 3.5–5.0)
Alkaline Phosphatase: 62 U/L (ref 38–126)
Anion gap: 8 (ref 5–15)
BUN: 18 mg/dL (ref 6–20)
CO2: 25 mmol/L (ref 22–32)
Calcium: 9.4 mg/dL (ref 8.9–10.3)
Chloride: 101 mmol/L (ref 98–111)
Creatinine, Ser: 0.7 mg/dL (ref 0.44–1.00)
GFR, Estimated: >60 mL/min (ref >60)
Glucose, Bld: 90 mg/dL (ref 70–99)
Potassium: 4.2 mmol/L (ref 3.5–5.1)
Sodium: 134 mmol/L — ABNORMAL LOW (ref 135–145)
Total Bilirubin: 0.2 mg/dL (ref 0.0–1.2)
Total Protein: 8.6 g/dL — ABNORMAL HIGH (ref 6.5–8.1)

## 2023-09-06 LAB — LIPASE, BLOOD: Lipase: 20 U/L (ref 11–51)

## 2023-09-06 LAB — D-DIMER, QUANTITATIVE: D-Dimer, Quant: 0.51 ug{FEU}/mL — ABNORMAL HIGH (ref 0.00–0.50)

## 2023-09-06 MED ORDER — IBUPROFEN 800 MG PO TABS: 800.0000 mg | ORAL_TABLET | Freq: Three times a day (TID) | ORAL | 0 refills | Status: DC | PRN

## 2023-09-06 MED ORDER — KETOROLAC TROMETHAMINE 30 MG/ML IJ SOLN: 30.0000 mg | Freq: Once | INTRAMUSCULAR | Status: DC

## 2023-09-06 MED ORDER — METHOCARBAMOL 500 MG PO TABS
500.0000 mg | ORAL_TABLET | Freq: Three times a day (TID) | ORAL | 0 refills | Status: DC | PRN
Start: 2023-09-06 — End: 2024-01-21

## 2023-09-06 MED ORDER — IOHEXOL 350 MG/ML SOLN
75.0000 mL | Freq: Once | INTRAVENOUS | Status: AC | PRN
  Administered 2023-09-06: 75 mL via INTRAVENOUS

## 2023-09-06 MED ORDER — KETOROLAC TROMETHAMINE 30 MG/ML IJ SOLN
30.0000 mg | Freq: Once | INTRAMUSCULAR | Status: AC
  Administered 2023-09-06: 30 mg via INTRAVENOUS
  Filled 2023-09-06: qty 1

## 2023-09-06 NOTE — ED Triage Notes (Signed)
Pt POV d/t right rib cage pain - sudden onset 2 hours ago.  Denies SOB or any other s/s.

## 2023-09-06 NOTE — Discharge Instructions (Signed)
You have been seen in the Emergency Department (ED) today for chest pain.  As we have discussed today?s test results are normal, and we believe your pain is due to pain/strain and/or inflammation of the muscles and/or cartilage of your chest wall.  We recommend you take ibuprofen 800 mg three times a day with meals for the next 5 days (unless you have been told previously not to take ibuprofen or NSAIDs in general).  You may also take Tylenol according to the label instructions.  Read through the included information for additional treatment recommendations and precautions. ° °Continue to take your regular medications.  ° °Return to the Emergency Department (ED) if you experience any further chest pain/pressure/tightness, difficulty breathing, or sudden sweating, or other symptoms that concern you. ° °

## 2023-09-06 NOTE — ED Provider Notes (Incomplete)
Emergency Department Provider Note   I have reviewed the triage vital signs and the nursing notes.   HISTORY  Chief Complaint rib cage pain   HPI Caitlin Anthony is a 23 y.o. female presents to the emergency department for evaluation of acute onset right-sided chest pain.  She traces the distribution of pain from her right lower anterior chest around to her back.  This began in the night and was without clear provoking factors.  No worsening symptoms with eating.  Denies shortness of breath or increased pain with deep breathing.  She has not noticed a rash.  Past Medical History:  Diagnosis Date   Asthma    Crohn's disease (HCC)     Review of Systems  Constitutional: No fever/chills Cardiovascular:Positive chest pain. Respiratory: Denies shortness of breath. Gastrointestinal: No abdominal pain.  No nausea, no vomiting.  No diarrhea. Musculoskeletal: Negative for back pain. Skin: Negative for rash. Neurological: Negative for headaches.  ____________________________________________   PHYSICAL EXAM:  VITAL SIGNS: ED Triage Vitals  Encounter Vitals Group     BP 09/06/23 0409 123/87     Pulse Rate 09/06/23 0409 90     Resp 09/06/23 0409 18     Temp 09/06/23 0409 97.9 F (36.6 C)     Temp Source 09/06/23 0409 Oral     SpO2 09/06/23 0409 100 %     Weight 09/06/23 0405 (!) 340 lb (154.2 kg)     Height 09/06/23 0405 5\' 4"  (1.626 m)   Constitutional: Alert and oriented. Well appearing and in no acute distress. Eyes: Conjunctivae are normal.  Head: Atraumatic. Nose: No congestion/rhinnorhea. Mouth/Throat: Mucous membranes are moist.  Neck: No stridor.  Cardiovascular: Normal rate, regular rhythm. Good peripheral circulation. Grossly normal heart sounds.   Respiratory: Normal respiratory effort.  No retractions. Lungs CTAB. Gastrointestinal: Soft and nontender. No distention.  Musculoskeletal: No lower extremity tenderness nor edema. No gross deformities of  extremities. Neurologic:  Normal speech and language. No gross focal neurologic deficits are appreciated.  Skin:  Skin is warm, dry and intact. No rash noted.  ____________________________________________   LABS (all labs ordered are listed, but only abnormal results are displayed)  Labs Reviewed  COMPREHENSIVE METABOLIC PANEL  LIPASE, BLOOD  CBC WITH DIFFERENTIAL/PLATELET  D-DIMER, QUANTITATIVE   ____________________________________________  EKG  *** ____________________________________________  RADIOLOGY  No results found.  ____________________________________________   PROCEDURES  Procedure(s) performed:   Procedures   ____________________________________________   INITIAL IMPRESSION / ASSESSMENT AND PLAN / ED COURSE  Pertinent labs & imaging results that were available during my care of the patient were reviewed by me and considered in my medical decision making (see chart for details).   This patient is Presenting for Evaluation of CP, which does require a range of treatment options, and is a complaint that involves a high risk of morbidity and mortality.  The Differential Diagnoses includes but is not exclusive to acute coronary syndrome, aortic dissection, pulmonary embolism, cardiac tamponade, community-acquired pneumonia, pericarditis, musculoskeletal chest wall pain, etc.   Critical Interventions-    Medications  ketorolac (TORADOL) 30 MG/ML injection 30 mg (has no administration in time range)    Reassessment after intervention:     Clinical Laboratory Tests Ordered, included ***  Radiologic Tests Ordered, included CXR. I independently interpreted the images and agree with radiology interpretation.   Cardiac Monitor Tracing which shows NSR.    Social Determinants of Health Risk patient is a non-smoker.   Consult complete with  Medical Decision Making:  Summary:  Patient presents emergency department with acute onset right sided rib pain.   No clear pleuritic description of her pain.  Vital signs are normal with normal O2 and no significant tachycardia.  She does have 1 tachycardia heart reading but 1 minute later was normal.  Unable to apply PERC rule in this case and have ordered D-dimer along with screening blood work and EKG but overall low suspicion for ACS or PE. Patient does take OCPs and elevated BMI.   Reevaluation with update and discussion with   ***Considered admission***  Patient's presentation is most consistent with acute presentation with potential threat to life or bodily function.   Disposition:   ____________________________________________  FINAL CLINICAL IMPRESSION(S) / ED DIAGNOSES  Final diagnoses:  None     NEW OUTPATIENT MEDICATIONS STARTED DURING THIS VISIT:  New Prescriptions   No medications on file    Note:  This document was prepared using Dragon voice recognition software and may include unintentional dictation errors.  Alona Bene, MD, [facility]

## 2023-09-22 ENCOUNTER — Other Ambulatory Visit: Payer: Self-pay

## 2024-01-14 ENCOUNTER — Emergency Department (HOSPITAL_COMMUNITY)

## 2024-01-14 ENCOUNTER — Emergency Department (HOSPITAL_COMMUNITY)
Admission: EM | Admit: 2024-01-14 | Discharge: 2024-01-14 | Disposition: A | Discharge status: Home / Self Care | Source: Home / Self Care

## 2024-01-14 ENCOUNTER — Other Ambulatory Visit: Payer: Self-pay

## 2024-01-14 ENCOUNTER — Emergency Department (HOSPITAL_BASED_OUTPATIENT_CLINIC_OR_DEPARTMENT_OTHER)

## 2024-01-14 ENCOUNTER — Ambulatory Visit (HOSPITAL_BASED_OUTPATIENT_CLINIC_OR_DEPARTMENT_OTHER)
Admission: EM | Admit: 2024-01-14 | Discharge: 2024-01-15 | Disposition: A | Discharge status: Home / Self Care | Attending: Emergency Medicine | Admitting: Emergency Medicine

## 2024-01-14 ENCOUNTER — Encounter (HOSPITAL_BASED_OUTPATIENT_CLINIC_OR_DEPARTMENT_OTHER): Payer: Self-pay

## 2024-01-14 DIAGNOSIS — J45909 Unspecified asthma, uncomplicated: Secondary | ICD-10-CM | POA: Insufficient documentation

## 2024-01-14 DIAGNOSIS — R599 Enlarged lymph nodes, unspecified: Secondary | ICD-10-CM | POA: Insufficient documentation

## 2024-01-14 DIAGNOSIS — R0981 Nasal congestion: Secondary | ICD-10-CM | POA: Insufficient documentation

## 2024-01-14 DIAGNOSIS — Z6841 Body Mass Index (BMI) 40.0 and over, adult: Secondary | ICD-10-CM | POA: Diagnosis not present

## 2024-01-14 DIAGNOSIS — R110 Nausea: Secondary | ICD-10-CM | POA: Insufficient documentation

## 2024-01-14 DIAGNOSIS — K810 Acute cholecystitis: Secondary | ICD-10-CM | POA: Diagnosis present

## 2024-01-14 DIAGNOSIS — K8010 Calculus of gallbladder with chronic cholecystitis without obstruction: Secondary | ICD-10-CM | POA: Insufficient documentation

## 2024-01-14 DIAGNOSIS — J3489 Other specified disorders of nose and nasal sinuses: Secondary | ICD-10-CM | POA: Insufficient documentation

## 2024-01-14 DIAGNOSIS — D649 Anemia, unspecified: Secondary | ICD-10-CM | POA: Insufficient documentation

## 2024-01-14 DIAGNOSIS — R079 Chest pain, unspecified: Secondary | ICD-10-CM

## 2024-01-14 DIAGNOSIS — K5090 Crohn's disease, unspecified, without complications: Secondary | ICD-10-CM | POA: Diagnosis not present

## 2024-01-14 DIAGNOSIS — E6689 Other obesity not elsewhere classified: Secondary | ICD-10-CM | POA: Insufficient documentation

## 2024-01-14 DIAGNOSIS — R0789 Other chest pain: Secondary | ICD-10-CM | POA: Insufficient documentation

## 2024-01-14 LAB — CBC
HCT: 35.9 % — ABNORMAL LOW (ref 36.0–46.0)
HCT: 35.1 % — ABNORMAL LOW (ref 36.0–46.0)
Hemoglobin: 10.5 g/dL — ABNORMAL LOW (ref 12.0–15.0)
Hemoglobin: 10.8 g/dL — ABNORMAL LOW (ref 12.0–15.0)
MCH: 21.8 pg — ABNORMAL LOW (ref 26.0–34.0)
MCH: 21.6 pg — ABNORMAL LOW (ref 26.0–34.0)
MCHC: 29.2 g/dL — ABNORMAL LOW (ref 30.0–36.0)
MCHC: 30.8 g/dL (ref 30.0–36.0)
MCV: 74.6 fL — ABNORMAL LOW (ref 80.0–100.0)
MCV: 70.1 fL — ABNORMAL LOW (ref 80.0–100.0)
Platelets: 389 10*3/uL (ref 150–400)
Platelets: 430 10*3/uL — ABNORMAL HIGH (ref 150–400)
RBC: 4.81 MIL/uL (ref 3.87–5.11)
RBC: 5.01 MIL/uL (ref 3.87–5.11)
RDW: 16.4 % — ABNORMAL HIGH (ref 11.5–15.5)
RDW: 16.5 % — ABNORMAL HIGH (ref 11.5–15.5)
WBC: 6.7 10*3/uL (ref 4.0–10.5)
WBC: 4.9 10*3/uL (ref 4.0–10.5)
nRBC: 0 % (ref 0.0–0.2)
nRBC: 0 % (ref 0.0–0.2)

## 2024-01-14 LAB — LIPASE, BLOOD: Lipase: 23 U/L (ref 11–51)

## 2024-01-14 LAB — BASIC METABOLIC PANEL WITH GFR
Anion gap: 10 (ref 5–15)
BUN: 14 mg/dL (ref 6–20)
CO2: 22 mmol/L (ref 22–32)
Calcium: 9.1 mg/dL (ref 8.9–10.3)
Chloride: 105 mmol/L (ref 98–111)
Creatinine, Ser: 0.76 mg/dL (ref 0.44–1.00)
GFR, Estimated: >60 mL/min (ref >60)
Glucose, Bld: 111 mg/dL — ABNORMAL HIGH (ref 70–99)
Potassium: 3.5 mmol/L (ref 3.5–5.1)
Sodium: 137 mmol/L (ref 135–145)

## 2024-01-14 LAB — COMPREHENSIVE METABOLIC PANEL WITH GFR
ALT: 86 U/L — ABNORMAL HIGH (ref 0–44)
AST: 123 U/L — ABNORMAL HIGH (ref 15–41)
Albumin: 4.2 g/dL (ref 3.5–5.0)
Alkaline Phosphatase: 102 U/L (ref 38–126)
Anion gap: 13 (ref 5–15)
BUN: 11 mg/dL (ref 6–20)
CO2: 21 mmol/L — ABNORMAL LOW (ref 22–32)
Calcium: 9.2 mg/dL (ref 8.9–10.3)
Chloride: 104 mmol/L (ref 98–111)
Creatinine, Ser: 0.76 mg/dL (ref 0.44–1.00)
GFR, Estimated: >60 mL/min (ref >60)
Glucose, Bld: 92 mg/dL (ref 70–99)
Potassium: 3.8 mmol/L (ref 3.5–5.1)
Sodium: 138 mmol/L (ref 135–145)
Total Bilirubin: 0.8 mg/dL (ref 0.0–1.2)
Total Protein: 8.3 g/dL — ABNORMAL HIGH (ref 6.5–8.1)

## 2024-01-14 LAB — URINALYSIS, MICROSCOPIC (REFLEX)

## 2024-01-14 LAB — TROPONIN T, HIGH SENSITIVITY: Troponin T High Sensitivity: <15 ng/L

## 2024-01-14 LAB — URINALYSIS, ROUTINE W REFLEX MICROSCOPIC
Glucose, UA: NEGATIVE mg/dL
Hgb urine dipstick: NEGATIVE
Ketones, ur: NEGATIVE mg/dL
Leukocytes,Ua: NEGATIVE
Nitrite: NEGATIVE
Protein, ur: 30 mg/dL — AB
Specific Gravity, Urine: 1.025 (ref 1.005–1.030)
pH: 6.5 (ref 5.0–8.0)

## 2024-01-14 LAB — HEPATIC FUNCTION PANEL
ALT: 31 U/L (ref 0–44)
AST: 49 U/L — ABNORMAL HIGH (ref 15–41)
Albumin: 3.4 g/dL — ABNORMAL LOW (ref 3.5–5.0)
Alkaline Phosphatase: 67 U/L (ref 38–126)
Bilirubin, Direct: 0.1 mg/dL (ref 0.0–0.2)
Indirect Bilirubin: 0.3 mg/dL (ref 0.3–0.9)
Total Bilirubin: 0.4 mg/dL (ref 0.0–1.2)
Total Protein: 7.6 g/dL (ref 6.5–8.1)

## 2024-01-14 LAB — TROPONIN I (HIGH SENSITIVITY)
Troponin I (High Sensitivity): 2 ng/L (ref <18)
Troponin I (High Sensitivity): <2 ng/L (ref <18)

## 2024-01-14 LAB — D-DIMER, QUANTITATIVE: D-Dimer, Quant: 0.28 ug{FEU}/mL (ref 0.00–0.50)

## 2024-01-14 LAB — HCG, SERUM, QUALITATIVE: Preg, Serum: NEGATIVE

## 2024-01-14 MED ORDER — KETOROLAC TROMETHAMINE 15 MG/ML IJ SOLN
15.0000 mg | Freq: Once | INTRAMUSCULAR | Status: AC
  Administered 2024-01-14: 15 mg via INTRAMUSCULAR
  Filled 2024-01-14: qty 1

## 2024-01-14 MED ORDER — ONDANSETRON HCL 4 MG/2ML IJ SOLN
4.0000 mg | Freq: Once | INTRAMUSCULAR | Status: AC
  Administered 2024-01-14: 4 mg via INTRAVENOUS
  Filled 2024-01-14: qty 2

## 2024-01-14 MED ORDER — HYDROMORPHONE HCL 1 MG/ML IJ SOLN
0.5000 mg | Freq: Once | INTRAMUSCULAR | Status: AC
  Administered 2024-01-14: 0.5 mg via INTRAVENOUS
  Filled 2024-01-14: qty 1

## 2024-01-14 MED ORDER — ACETAMINOPHEN 500 MG PO TABS
1000.0000 mg | ORAL_TABLET | Freq: Once | ORAL | Status: AC
  Administered 2024-01-14: 1000 mg via ORAL
  Filled 2024-01-14: qty 2

## 2024-01-14 MED ORDER — MORPHINE SULFATE (PF) 4 MG/ML IV SOLN
4.0000 mg | Freq: Once | INTRAVENOUS | Status: AC
  Administered 2024-01-14: 4 mg via INTRAVENOUS
  Filled 2024-01-14: qty 1

## 2024-01-14 MED ORDER — SODIUM CHLORIDE 0.9 % IV SOLN
2.0000 g | Freq: Once | INTRAVENOUS | Status: AC
  Administered 2024-01-14: 2 g via INTRAVENOUS
  Filled 2024-01-14: qty 20

## 2024-01-14 MED ORDER — ALUM & MAG HYDROXIDE-SIMETH 200-200-20 MG/5ML PO SUSP
30.0000 mL | Freq: Once | ORAL | Status: AC
  Administered 2024-01-14: 30 mL via ORAL
  Filled 2024-01-14: qty 30

## 2024-01-14 MED ORDER — ONDANSETRON 4 MG PO TBDP
4.0000 mg | ORAL_TABLET | Freq: Once | ORAL | Status: AC
  Administered 2024-01-14: 4 mg via ORAL
  Filled 2024-01-14: qty 1

## 2024-01-14 NOTE — ED Provider Notes (Signed)
 [place] EMERGENCY DEPARTMENT AT [address] [facility] Provider Note   CSN: [id number] Arrival date & time: 01/14/24  4010     History  Chief Complaint  Patient presents with   Chest Pain    Capri Veals is a 23 y.o. female with PMHx asthma, crohn's disease, who presents to ED concerned for chest pain. Chest pain started this morning when she woke up around 6:15AM. Chest pain is constant and central. Patient also endorses pain with deep inspirations and nausea. Endorses rhinorrhea and congestion x3-4 days.   Denies fever, cough, vomiting, diarrhea. Denies recent surgery/immobilization, hx DVT/PE, hemoptysis, hx cancer in the past 6 months, calf swelling/tenderness. Denies tobacco use. Denies birth control use. Denies new medication.   HPI     Home Medications Prior to Admission medications   Medication Sig Start Date End Date Taking? Authorizing Provider  acetaminophen  (TYLENOL ) 325 MG tablet Take 2 tablets (650 mg total) by mouth every 6 (six) hours as needed for up to 30 doses. Patient not taking: Reported on 11/11/2022 01/31/22   Arvilla Birmingham, MD  albuterol  (VENTOLIN  HFA) 108 [phone] Base) MCG/ACT inhaler Inhale 2 puffs into the lungs every 4 (four) hours as needed for wheezing or shortness of breath (cough). 09/01/23   Charmayne Cooper, MD  bacitracin  ointment Apply 1 Application topically 2 (two) times daily. 12/05/22   Horton, Vonzella Guernsey, MD  buPROPion (WELLBUTRIN XL) 300 MG 24 hr tablet Take 300 mg by mouth daily.    [provider]  busPIRone (BUSPAR) 10 MG tablet Take 10 mg by mouth 3 (three) times daily.    [provider]  clindamycin  (CLEOCIN ) 300 MG capsule Take 1 capsule (300 mg total) by mouth 3 (three) times daily. X 7 days 11/22/22   Eldon Greenland, MD  ibuprofen  (ADVIL ) 800 MG tablet Take 1 tablet (800 mg total) by mouth every 8 (eight) hours as needed for moderate pain (pain score 4-6). 09/06/23   Long, Shereen Dike, MD  methocarbamol  (ROBAXIN ) 500 MG  tablet Take 1 tablet (500 mg total) by mouth every 8 (eight) hours as needed for muscle spasms. 09/06/23   Long, Shereen Dike, MD  naproxen  (NAPROSYN ) 500 MG tablet Take 1 tablet (500 mg total) by mouth 2 (two) times daily. 07/23/23   Horton, Vonzella Guernsey, MD  predniSONE  (STERAPRED UNI-PAK 21 TAB) 10 MG (21) TBPK tablet 10mg  Tabs, 6 day taper. Use as directed 09/01/23   Charmayne Cooper, MD  promethazine -dextromethorphan (PROMETHAZINE -DM) 6.25-15 MG/5ML syrup Take 5 mLs by mouth 4 (four) times daily as needed for cough. 09/01/23   Charmayne Cooper, MD  Vitamin D, Ergocalciferol, (DRISDOL) 1.25 MG (50000 UNIT) CAPS capsule Take 50,000 Units by mouth every Monday. Patient not taking: Reported on 11/11/2022 09/19/19   [provider]      Allergies    Hydrocodone and Pineapple    Review of Systems   Review of Systems  Cardiovascular:  Positive for chest pain.    Physical Exam Updated Vital Signs BP 117/86 (BP Location: Right Arm)   Pulse 77   Temp 98.3 F (36.8 C) (Oral)   Resp 11   Ht 5' 4 (1.626 m)   Wt (!) 158.8 kg   SpO2 100%   BMI 60.08 kg/m  Physical Exam Vitals and nursing note reviewed.  Constitutional:      General: She is not in acute distress.    Appearance: She is not ill-appearing or toxic-appearing.  HENT:     Head:  Normocephalic and atraumatic.     Mouth/Throat:     Mouth: Mucous membranes are moist.     Pharynx: No oropharyngeal exudate or posterior oropharyngeal erythema.   Eyes:     General: No scleral icterus.       Right eye: No discharge.        Left eye: No discharge.     Conjunctiva/sclera: Conjunctivae normal.    Cardiovascular:     Rate and Rhythm: Normal rate and regular rhythm.     Pulses: Normal pulses.     Heart sounds: Normal heart sounds. No murmur heard. Pulmonary:     Effort: Pulmonary effort is normal. No respiratory distress.     Breath sounds: Normal breath sounds. No wheezing, rhonchi or rales.  Chest:     Chest wall: Tenderness  present.     Comments: Central chest wall tenderness to palpation Abdominal:     General: Bowel sounds are normal.     Palpations: Abdomen is soft. There is no mass.     Tenderness: There is no abdominal tenderness.   Musculoskeletal:     Right lower leg: No edema.     Left lower leg: No edema.     Comments: No calf tenderness   Skin:    General: Skin is warm and dry.     Findings: No rash.   Neurological:     General: No focal deficit present.     Mental Status: She is alert and oriented to person, place, and time. Mental status is at baseline.   Psychiatric:        Mood and Affect: Mood normal.        Behavior: Behavior normal.     ED Results / Procedures / Treatments   Labs (all labs ordered are listed, but only abnormal results are displayed) Labs Reviewed  BASIC METABOLIC PANEL WITH GFR - Abnormal; Notable for the following components:      Result Value   Glucose, Bld 111 (*)    All other components within normal limits  CBC - Abnormal; Notable for the following components:   Hemoglobin 10.5 (*)    HCT 35.9 (*)    MCV 74.6 (*)    MCH 21.8 (*)    MCHC 29.2 (*)    RDW 16.4 (*)    All other components within normal limits  HEPATIC FUNCTION PANEL - Abnormal; Notable for the following components:   Albumin 3.4 (*)    AST 49 (*)    All other components within normal limits  HCG, SERUM, QUALITATIVE  D-DIMER, QUANTITATIVE  TROPONIN I (HIGH SENSITIVITY)  TROPONIN I (HIGH SENSITIVITY)    EKG EKG Interpretation Date/Time:  Thursday January 14 2024 07:29:55 EDT Ventricular Rate:  83 PR Interval:  174 QRS Duration:  99 QT Interval:  360 QTC Calculation: 423 R Axis:   84  Text Interpretation: Sinus rhythm Probable left atrial enlargement Confirmed by Elise Guile [phone]) on 01/14/2024 8:03:37 AM  Radiology DG Chest 2 View Result Date: 01/14/2024 CLINICAL DATA:  chest pain EXAM: CHEST - 2 VIEW COMPARISON:  None available. FINDINGS: No focal airspace consolidation,  pleural effusion, or pneumothorax. No cardiomegaly. No acute fracture or destructive lesion. IMPRESSION: No acute cardiopulmonary abnormality. Electronically Signed   By: Rance Burrows M.D.   On: 01/14/2024 08:45    Procedures Procedures    Medications Ordered in ED Medications  morphine  (PF) 4 MG/ML injection 4 mg (4 mg Intravenous Given 01/14/24 0817)  ondansetron  (ZOFRAN ) injection 4  mg (4 mg Intravenous Given 01/14/24 0817)  acetaminophen  (TYLENOL ) tablet 1,000 mg (1,000 mg Oral Given 01/14/24 1041)  ondansetron  (ZOFRAN ) injection 4 mg (4 mg Intravenous Given 01/14/24 1042)  ketorolac  (TORADOL ) 15 MG/ML injection 15 mg (15 mg Intramuscular Given 01/14/24 1222)  ondansetron  (ZOFRAN -ODT) disintegrating tablet 4 mg (4 mg Oral Given 01/14/24 1224)  alum & mag hydroxide-simeth (MAALOX/MYLANTA) 200-200-20 MG/5ML suspension 30 mL (30 mLs Oral Given 01/14/24 1222)    ED Course/ Medical Decision Making/ A&P                                 Medical Decision Making Amount and/or Complexity of Data Reviewed Labs: ordered. Radiology: ordered.  Risk OTC drugs. Prescription drug management.   This patient presents to the ED for concern of chest pain, this involves an extensive number of treatment options, and is a complaint that carries with it a high risk of complications and morbidity.  The differential diagnosis includes acute coronary syndrome, congestive heart failure, pericarditis, pneumonia, pulmonary embolism, tension pneumothorax, esophageal rupture, aortic dissection, cardiac tamponade, musculoskeletal   Co morbidities that complicate the patient evaluation  asthma, crohn's disease   Additional history obtained:  PCP not listed in chart   Risk Stratification Score:  HEART Score: 0 Wells Score: 0   Problem List / ED Course / Critical interventions / Medication management  Patient presented for chest pain.  Patient also endorsing recent nausea, rhinorrhea, and congestion.   Physical exam with central chest wall tenderness to palpation.  Rest of physical exam reassuring.  Patient afebrile with stable vitals. I Ordered, and personally interpreted labs.  hCG negative.  Initial and repeat troponin within normal limits.  D-dimer within normal limits.  CBC without leukocytosis.  There is mild anemia near patient's baseline currently with hemoglobin of 10.5.  BMP reassuring.  Hepatic function panel with very slight elevation in AST at 49. The patient was maintained on a cardiac monitor.  I personally viewed and interpreted the EKG/cardiac monitored which showed an underlying rhythm of: sinus rhythm. I ordered imaging studies including chest xray to assess for process contributing to patient's symptoms. I independently visualized and interpreted imaging which showed no acute process. I agree with the radiologist interpretation. Shared all results with patient.  Answered all questions.  Patient stating that pain is feeling better after Tylenol . Patient also noting slightly more relief after GI cocktail.  It appears the patient's symptoms are due to costochondritis vs muscle strain, but there may also be a component of GERD. Patient stating that she has had problems with GERD in the past and follows with outpatient GI for her stomach concerns.  Recommended following up with PCP and GI.  Patient verbalized understanding of plan. Educated patient alternating Advil  and Tylenol  for pain.  I have reviewed the patients home medicines and have made adjustments as needed The patient has been appropriately medically screened and/or stabilized in the ED. I have low suspicion for any other emergent medical condition which would require further screening, evaluation or treatment in the ED or require inpatient management. At time of discharge the patient is hemodynamically stable and in no acute distress. I have discussed work-up results and diagnosis with patient and answered all questions. Patient  is agreeable with discharge plan. We discussed strict return precautions for returning to the emergency department and they verbalized understanding.    Ddx:  These are considered less likely due to history  of present illness and physical exam findings.  -Acute coronary syndrome: EKG and troponins within normal limits  -Congestive heart failure: Physical exam reassuring -Pulmonary embolism: D-dimer negative -Pneumothorax: lungs are clear to auscultation bilaterally -Aortic dissection: D-dimer is negative and chronicity of symptoms along with stable vitals would be atypical -Cardiac tamponade: Physical exam reassuring   Social Determinants of Health:  none         Final Clinical Impression(s) / ED Diagnoses Final diagnoses:  Nonspecific chest pain    Rx / DC Orders ED Discharge Orders     None         Hardin Bureau, [place] 01/14/24 1240    Rolinda Climes, DO 01/14/24 1520

## 2024-01-14 NOTE — ED Triage Notes (Signed)
 Pt woke up this morning with pain in her chest that radiates to her back. Pt describes it as a burning sensation. Denies any nausea or vomiting.

## 2024-01-14 NOTE — ED Triage Notes (Signed)
 Pt arrives with c/o epigastric pain that radiates into her chest that started this morning. Pt seen for the same this morning. Pt endorses nausea. Pt describes pain as a burning sensation and pain is worse after eating.

## 2024-01-14 NOTE — ED Provider Notes (Signed)
  EMERGENCY DEPARTMENT AT [facility] Provider Note   CSN: [id number] Arrival date & time: 01/14/24  8295     Patient presents with: Abdominal Pain and Chest Pain   Caitlin Anthony is a 23 y.o. female with past medical history of asthma and Crohn's disease is reporting to the emergency room with complaint of epigastric pain radiating across to her right upper quadrant of her abdomen and around her back.  She reports this started this morning and was quite severe.  It is associated with nausea but no vomiting.  She has noticed some loose stools over the past 3 days.  Pain is exacerbated with eating.  She was seen at [facility] emergency room this morning for similar complaint and had reassuring workup and thus discharged. Has Chron's dose not feel symptoms are similar.     Abdominal Pain Associated symptoms: chest pain   Chest Pain Associated symptoms: abdominal pain        Prior to Admission medications   Medication Sig Start Date End Date Taking? Authorizing Provider  acetaminophen  (TYLENOL ) 325 MG tablet Take 2 tablets (650 mg total) by mouth every 6 (six) hours as needed for up to 30 doses. Patient not taking: Reported on 11/11/2022 01/31/22   Arvilla Birmingham, MD  albuterol  (VENTOLIN  HFA) 108 [phone] Base) MCG/ACT inhaler Inhale 2 puffs into the lungs every 4 (four) hours as needed for wheezing or shortness of breath (cough). 09/01/23   Charmayne Cooper, MD  bacitracin  ointment Apply 1 Application topically 2 (two) times daily. 12/05/22   Horton, Vonzella Guernsey, MD  buPROPion (WELLBUTRIN XL) 300 MG 24 hr tablet Take 300 mg by mouth daily.    [provider]  busPIRone (BUSPAR) 10 MG tablet Take 10 mg by mouth 3 (three) times daily.    [provider]  clindamycin  (CLEOCIN ) 300 MG capsule Take 1 capsule (300 mg total) by mouth 3 (three) times daily. X 7 days 11/22/22   Eldon Greenland, MD  ibuprofen  (ADVIL ) 800 MG tablet Take 1 tablet (800 mg total) by mouth  every 8 (eight) hours as needed for moderate pain (pain score 4-6). 09/06/23   Long, Shereen Dike, MD  methocarbamol  (ROBAXIN ) 500 MG tablet Take 1 tablet (500 mg total) by mouth every 8 (eight) hours as needed for muscle spasms. 09/06/23   Long, Shereen Dike, MD  naproxen  (NAPROSYN ) 500 MG tablet Take 1 tablet (500 mg total) by mouth 2 (two) times daily. 07/23/23   Horton, Vonzella Guernsey, MD  predniSONE  (STERAPRED UNI-PAK 21 TAB) 10 MG (21) TBPK tablet 10mg  Tabs, 6 day taper. Use as directed 09/01/23   Charmayne Cooper, MD  promethazine -dextromethorphan (PROMETHAZINE -DM) 6.25-15 MG/5ML syrup Take 5 mLs by mouth 4 (four) times daily as needed for cough. 09/01/23   Charmayne Cooper, MD  Vitamin D, Ergocalciferol, (DRISDOL) 1.25 MG (50000 UNIT) CAPS capsule Take 50,000 Units by mouth every Monday. Patient not taking: Reported on 11/11/2022 09/19/19   [provider]    Allergies: Hydrocodone and Pineapple    Review of Systems  Cardiovascular:  Positive for chest pain.  Gastrointestinal:  Positive for abdominal pain.    Updated Vital Signs BP (!) 141/91   Pulse 96   Temp 98.1 F (36.7 C) (Oral)   Resp 15   Wt (!) 158.8 kg   SpO2 100%   BMI 60.08 kg/m   Physical Exam Vitals and nursing note reviewed.  Constitutional:      General: She is  not in acute distress.    Appearance: She is not toxic-appearing.     Comments: Just tearful in room expressing pain.  HENT:     Head: Normocephalic and atraumatic.   Eyes:     General: No scleral icterus.    Conjunctiva/sclera: Conjunctivae normal.    Cardiovascular:     Rate and Rhythm: Normal rate and regular rhythm.     Pulses: Normal pulses.     Heart sounds: Normal heart sounds.  Pulmonary:     Effort: Pulmonary effort is normal. No respiratory distress.     Breath sounds: Normal breath sounds.  Chest:     Chest wall: No tenderness.  Abdominal:     General: Abdomen is flat. Bowel sounds are normal.     Palpations: Abdomen is soft.      Tenderness: There is abdominal tenderness.     Comments: Tenderness to palpation in epigastric and right upper quadrant of abdomen.   Musculoskeletal:     Comments: Strong radial pulse equal bilaterally.  Blood pressure equal in both extremities.   Skin:    General: Skin is warm and dry.     Findings: No lesion.   Neurological:     General: No focal deficit present.     Mental Status: She is alert and oriented to person, place, and time. Mental status is at baseline.     (all labs ordered are listed, but only abnormal results are displayed) Labs Reviewed  COMPREHENSIVE METABOLIC PANEL WITH GFR - Abnormal; Notable for the following components:      Result Value   CO2 21 (*)    Total Protein 8.3 (*)    AST 123 (*)    ALT 86 (*)    All other components within normal limits  CBC - Abnormal; Notable for the following components:   Hemoglobin 10.8 (*)    HCT 35.1 (*)    MCV 70.1 (*)    MCH 21.6 (*)    RDW 16.5 (*)    Platelets 430 (*)    All other components within normal limits  URINALYSIS, ROUTINE W REFLEX MICROSCOPIC - Abnormal; Notable for the following components:   APPearance HAZY (*)    Bilirubin Urine SMALL (*)    Protein, ur 30 (*)    All other components within normal limits  URINALYSIS, MICROSCOPIC (REFLEX) - Abnormal; Notable for the following components:   Bacteria, UA FEW (*)    All other components within normal limits  LIPASE, BLOOD  TROPONIN T, HIGH SENSITIVITY    EKG: EKG Interpretation Date/Time:  Thursday January 14 2024 19:29:11 EDT Ventricular Rate:  89 PR Interval:  164 QRS Duration:  98 QT Interval:  353 QTC Calculation: 430 R Axis:   89  Text Interpretation: Sinus rhythm No significant change since last tracing Confirmed by Zackowski, Scott [phone]) on 01/14/2024 7:31:45 PM  Radiology: [facility] Query Chest 2 View Result Date: 01/14/2024 CLINICAL DATA:  chest pain EXAM: CHEST - 2 VIEW COMPARISON:  None available. FINDINGS: No focal airspace consolidation,  pleural effusion, or pneumothorax. No cardiomegaly. No acute fracture or destructive lesion. IMPRESSION: No acute cardiopulmonary abnormality. Electronically Signed   By: Rance Burrows M.D.   On: 01/14/2024 08:45     Procedures   Medications Ordered in the ED  morphine  (PF) 4 MG/ML injection 4 mg (4 mg Intravenous Given 01/14/24 2055)  ondansetron  (ZOFRAN ) injection 4 mg (4 mg Intravenous Given 01/14/24 2055)    Clinical Course as of 01/14/24 2334  Thu  Jan 14, 2024  2233 Spoke with Dr. Andy Bannister.  Dr. Andy Bannister reviewed labs and imaging.  With patient's continued pain recommending transfer to [facility] preop tomorrow morning.  I will start Rocephin . [JB]    Clinical Course User Index [JB] Caitlin Anthony, Kandace Organ, PA-C                                 Medical Decision Making Amount and/or Complexity of Data Reviewed Labs: ordered. Radiology: ordered.  Risk Prescription drug management.   This patient presents to the ED for concern of chest pain/abdominal pain, this involves an extensive number of treatment options, and is a complaint that carries with it a high risk of complications and morbidity.  The differential diagnosis includes choledocholithiasis, appendicitis, cholecystitis, pancreatitis, small bowel obstruction, pulmonary embolism, aortic dissection, ACS   Co morbidities that complicate the patient evaluation  Crohn's disease, asthma   Additional history obtained:  Additional history obtained from Maryan Smalling, ED visit this morning in which patient had reassuring troponin, D-dimer basic labs and chest x-ray.   Lab Tests:  I personally interpreted labs.  The pertinent results include:   CBC without leukocytosis.  Hemoglobin is stable at 10.8.  Platelets 430 CMP with elevation in AST, ALT and normal total bilirubin Lipase within normal limits Urinalysis shows few bacteria but negative for nitrates and leukocytes.   Imaging Studies ordered:  I ordered imaging studies  including right upper quadrant ultrasound I independently visualized and interpreted imaging which showed cholelithiasis and positive Murphy sign equivalent to acute cholecystitis.  No wall thickening or Perry cholecystic fluid. I agree with the radiologist interpretation   Cardiac Monitoring: / EKG:  The patient was maintained on a cardiac monitor.  I personally viewed and interpreted the cardiac monitored which showed an underlying rhythm of: Normal sinus rhythm   Consultations Obtained:  I requested consultation with the general surgery Dr. Andy Bannister,  and discussed lab and imaging findings as well as pertinent plan - they recommend: Transfer to preop at [facility] tomorrow   Problem List / ED Course / Critical interventions / Medication management  Presents to emergency room with complaint of chest and abdominal pain.  She locates this to epigastric area and right upper quadrant.  She has tenderness that is reproducible on exam to right upper quadrant.  She notes this is associated with nausea and worse with eating.  She was seen for similar symptoms with overall reassuring workup today.  She had 2 negative troponins thus do not suspect ACS.  Symptoms do not seem consistent with aortic dissection.  She has no risk factor for PE DVT and had negative D-dimer this morning.  She has had mild elevation in her liver enzymes since labs this morning with AST of 123, ALT of 86.  Given reproducible right upper quadrant pain on exam will obtain right upper quadrant US  to rule out acute cholecystitis or other pathology then reassess.  Ultrasound findings suggest early acute cholecystitis.  I talked to general surgery.  Patient continues to have pain.  I feel this is consistent with patient's symptoms. Still will stay here overnight and go for surgery tomorrow. Needs NPO at midnight and pain control.  I ordered medication including morphine , Zofran  for pain control Reevaluation of the patient after these  medicines showed that the patient improved I have reviewed the patients home medicines and have made adjustments as needed  Final diagnoses:  Acute cholecystitis    ED Discharge Orders     None          Adina Ahle 01/14/24 2339    Nicklas Barns, MD 01/15/24 [phone]

## 2024-01-14 NOTE — Discharge Instructions (Addendum)
 It was a pleasure caring for you today.  Please follow-up with primary care provider.  Seek emergency care experiencing any new or worsening symptoms.  Alternating between 650 mg Tylenol  and 400 mg Advil : The best way to alternate taking Acetaminophen  (example Tylenol ) and Ibuprofen  (example Advil /Motrin ) is to take them 3 hours apart. For example, if you take ibuprofen  at 6 am you can then take Tylenol  at 9 am. You can continue this regimen throughout the day, making sure you do not exceed the recommended maximum dose for each drug.

## 2024-01-15 ENCOUNTER — Other Ambulatory Visit: Payer: Self-pay

## 2024-01-15 ENCOUNTER — Encounter (HOSPITAL_COMMUNITY): Payer: Self-pay

## 2024-01-15 ENCOUNTER — Emergency Department (HOSPITAL_COMMUNITY)

## 2024-01-15 ENCOUNTER — Emergency Department (HOSPITAL_BASED_OUTPATIENT_CLINIC_OR_DEPARTMENT_OTHER)

## 2024-01-15 ENCOUNTER — Encounter (HOSPITAL_COMMUNITY)
Admission: EM | Disposition: A | Discharge status: Home / Self Care | Payer: Self-pay | Source: Home / Self Care | Attending: Emergency Medicine

## 2024-01-15 DIAGNOSIS — Z6841 Body Mass Index (BMI) 40.0 and over, adult: Secondary | ICD-10-CM

## 2024-01-15 DIAGNOSIS — E6689 Other obesity not elsewhere classified: Secondary | ICD-10-CM | POA: Diagnosis not present

## 2024-01-15 DIAGNOSIS — K810 Acute cholecystitis: Secondary | ICD-10-CM | POA: Diagnosis present

## 2024-01-15 DIAGNOSIS — K8010 Calculus of gallbladder with chronic cholecystitis without obstruction: Secondary | ICD-10-CM | POA: Diagnosis not present

## 2024-01-15 DIAGNOSIS — J45909 Unspecified asthma, uncomplicated: Secondary | ICD-10-CM

## 2024-01-15 DIAGNOSIS — K5090 Crohn's disease, unspecified, without complications: Secondary | ICD-10-CM | POA: Diagnosis not present

## 2024-01-15 DIAGNOSIS — R599 Enlarged lymph nodes, unspecified: Secondary | ICD-10-CM | POA: Diagnosis not present

## 2024-01-15 HISTORY — PX: CHOLECYSTECTOMY: SHX55

## 2024-01-15 SURGERY — LAPAROSCOPIC CHOLECYSTECTOMY
Anesthesia: General

## 2024-01-15 MED ORDER — SODIUM CHLORIDE (PF) 0.9 % IJ SOLN
INTRAMUSCULAR | Status: DC | PRN
  Administered 2024-01-15: 40 mL

## 2024-01-15 MED ORDER — KETAMINE HCL 50 MG/5ML IJ SOSY
PREFILLED_SYRINGE | INTRAMUSCULAR | Status: DC | PRN
  Administered 2024-01-15: 30 mg via INTRAVENOUS
  Administered 2024-01-15: 10 mg via INTRAVENOUS

## 2024-01-15 MED ORDER — KETOROLAC TROMETHAMINE 30 MG/ML IJ SOLN
INTRAMUSCULAR | Status: AC
  Filled 2024-01-15: qty 1

## 2024-01-15 MED ORDER — PROPOFOL 10 MG/ML IV BOLUS
INTRAVENOUS | Status: AC
Start: 2024-01-15 — End: 2024-01-15
  Filled 2024-01-15: qty 20

## 2024-01-15 MED ORDER — IBUPROFEN 800 MG PO TABS: 800.0000 mg | ORAL_TABLET | Freq: Three times a day (TID) | ORAL | 0 refills | Status: AC | PRN

## 2024-01-15 MED ORDER — ACETAMINOPHEN 10 MG/ML IV SOLN
INTRAVENOUS | Status: AC
  Filled 2024-01-15: qty 100

## 2024-01-15 MED ORDER — MIDAZOLAM HCL 2 MG/2ML IJ SOLN
INTRAMUSCULAR | Status: AC
Start: 2024-01-15 — End: 2024-01-15
  Filled 2024-01-15: qty 2

## 2024-01-15 MED ORDER — PROPOFOL 10 MG/ML IV BOLUS
INTRAVENOUS | Status: DC | PRN
  Administered 2024-01-15: 100 mg via INTRAVENOUS
  Administered 2024-01-15: 200 mg via INTRAVENOUS

## 2024-01-15 MED ORDER — DEXAMETHASONE SODIUM PHOSPHATE 10 MG/ML IJ SOLN
INTRAMUSCULAR | Status: DC | PRN
  Administered 2024-01-15: 5 mg via INTRAVENOUS

## 2024-01-15 MED ORDER — OXYCODONE HCL 5 MG PO TABS
5.0000 mg | ORAL_TABLET | Freq: Once | ORAL | Status: AC | PRN
  Administered 2024-01-15: 5 mg via ORAL

## 2024-01-15 MED ORDER — KETAMINE HCL 50 MG/5ML IJ SOSY
PREFILLED_SYRINGE | INTRAMUSCULAR | Status: AC
  Filled 2024-01-15: qty 5

## 2024-01-15 MED ORDER — FENTANYL CITRATE PF 50 MCG/ML IJ SOSY
PREFILLED_SYRINGE | INTRAMUSCULAR | Status: AC
  Filled 2024-01-15: qty 1

## 2024-01-15 MED ORDER — ACETAMINOPHEN 10 MG/ML IV SOLN
1000.0000 mg | Freq: Once | INTRAVENOUS | Status: DC | PRN
  Administered 2024-01-15: 1000 mg via INTRAVENOUS

## 2024-01-15 MED ORDER — CHLORHEXIDINE GLUCONATE 0.12 % MT SOLN
15.0000 mL | Freq: Once | OROMUCOSAL | Status: AC
  Administered 2024-01-15: 15 mL via OROMUCOSAL

## 2024-01-15 MED ORDER — OXYCODONE HCL 5 MG PO TABS
ORAL_TABLET | ORAL | Status: AC
  Filled 2024-01-15: qty 1

## 2024-01-15 MED ORDER — ROCURONIUM BROMIDE 100 MG/10ML IV SOLN
INTRAVENOUS | Status: DC | PRN
  Administered 2024-01-15: 80 mg via INTRAVENOUS

## 2024-01-15 MED ORDER — ONDANSETRON HCL 4 MG/2ML IJ SOLN
INTRAMUSCULAR | Status: DC | PRN
  Administered 2024-01-15: 4 mg via INTRAVENOUS

## 2024-01-15 MED ORDER — LIDOCAINE HCL (CARDIAC) PF 100 MG/5ML IV SOSY
PREFILLED_SYRINGE | INTRAVENOUS | Status: DC | PRN
  Administered 2024-01-15: 100 mg via INTRAVENOUS

## 2024-01-15 MED ORDER — BUPIVACAINE-EPINEPHRINE (PF) 0.25% -1:200000 IJ SOLN
INTRAMUSCULAR | Status: AC
  Filled 2024-01-15: qty 30

## 2024-01-15 MED ORDER — BUPIVACAINE-EPINEPHRINE 0.25% -1:200000 IJ SOLN
INTRAMUSCULAR | Status: DC | PRN
  Administered 2024-01-15: 30 mL

## 2024-01-15 MED ORDER — LACTATED RINGERS IV SOLN: INTRAVENOUS | Status: DC | PRN

## 2024-01-15 MED ORDER — SUGAMMADEX SODIUM 200 MG/2ML IV SOLN
INTRAVENOUS | Status: DC | PRN
  Administered 2024-01-15: 300 mg via INTRAVENOUS

## 2024-01-15 MED ORDER — FENTANYL CITRATE PF 50 MCG/ML IJ SOSY
PREFILLED_SYRINGE | INTRAMUSCULAR | Status: AC
  Filled 2024-01-15: qty 2

## 2024-01-15 MED ORDER — OXYCODONE HCL 5 MG PO TABS: 5.0000 mg | ORAL_TABLET | Freq: Four times a day (QID) | ORAL | 0 refills | Status: AC | PRN

## 2024-01-15 MED ORDER — PROPOFOL 10 MG/ML IV BOLUS
INTRAVENOUS | Status: AC
  Filled 2024-01-15: qty 20

## 2024-01-15 MED ORDER — DEXAMETHASONE SODIUM PHOSPHATE 10 MG/ML IJ SOLN
INTRAMUSCULAR | Status: AC
  Filled 2024-01-15: qty 1

## 2024-01-15 MED ORDER — ONDANSETRON HCL 4 MG/2ML IJ SOLN
INTRAMUSCULAR | Status: AC
  Filled 2024-01-15: qty 2

## 2024-01-15 MED ORDER — DROPERIDOL 2.5 MG/ML IJ SOLN: 0.6250 mg | Freq: Once | INTRAMUSCULAR | Status: DC | PRN

## 2024-01-15 MED ORDER — FENTANYL CITRATE (PF) 250 MCG/5ML IJ SOLN
INTRAMUSCULAR | Status: AC
  Filled 2024-01-15: qty 5

## 2024-01-15 MED ORDER — FENTANYL CITRATE (PF) 100 MCG/2ML IJ SOLN
INTRAMUSCULAR | Status: DC | PRN
  Administered 2024-01-15: 100 ug via INTRAVENOUS
  Administered 2024-01-15 (×2): 50 ug via INTRAVENOUS

## 2024-01-15 MED ORDER — KETOROLAC TROMETHAMINE 15 MG/ML IJ SOLN
INTRAMUSCULAR | Status: DC | PRN
  Administered 2024-01-15: 30 mg via INTRAVENOUS

## 2024-01-15 MED ORDER — LACTATED RINGERS IV SOLN: INTRAVENOUS | Status: DC

## 2024-01-15 MED ORDER — OXYCODONE HCL 5 MG/5ML PO SOLN: 5.0000 mg | Freq: Once | ORAL | Status: AC | PRN

## 2024-01-15 MED ORDER — MIDAZOLAM HCL 5 MG/5ML IJ SOLN
INTRAMUSCULAR | Status: DC | PRN
  Administered 2024-01-15: 2 mg via INTRAVENOUS

## 2024-01-15 MED ORDER — FENTANYL CITRATE PF 50 MCG/ML IJ SOSY
25.0000 ug | PREFILLED_SYRINGE | INTRAMUSCULAR | Status: DC | PRN
  Administered 2024-01-15 (×3): 50 ug via INTRAVENOUS

## 2024-01-15 MED ORDER — FENTANYL CITRATE PF 50 MCG/ML IJ SOSY
50.0000 ug | PREFILLED_SYRINGE | Freq: Once | INTRAMUSCULAR | Status: AC
  Administered 2024-01-15: 50 ug via INTRAVENOUS
  Filled 2024-01-15: qty 1

## 2024-01-15 MED ORDER — 0.9 % SODIUM CHLORIDE (POUR BTL) OPTIME
TOPICAL | Status: DC | PRN
  Administered 2024-01-15: 1000 mL

## 2024-01-15 SURGICAL SUPPLY — 39 items
BAG COUNTER SPONGE SURGICOUNT (BAG) IMPLANT
BENZOIN TINCTURE PRP APPL 2/3 (GAUZE/BANDAGES/DRESSINGS) IMPLANT
BNDG ADH 1X3 SHEER STRL LF (GAUZE/BANDAGES/DRESSINGS) IMPLANT
CABLE HIGH FREQUENCY MONO STRZ (ELECTRODE) ×1 IMPLANT
CHLORAPREP W/TINT 26 (MISCELLANEOUS) ×1 IMPLANT
CLIP APPLIE 5 13 M/L LIGAMAX5 (MISCELLANEOUS) IMPLANT
CLIP APPLIE ROT 10 11.4 M/L (STAPLE) IMPLANT
CLIP LIGATING HEM O LOK PURPLE (MISCELLANEOUS) IMPLANT
CLIP LIGATING HEMO O LOK GREEN (MISCELLANEOUS) ×1 IMPLANT
COVER MAYO STAND XLG (MISCELLANEOUS) ×1 IMPLANT
COVER SURGICAL LIGHT HANDLE (MISCELLANEOUS) ×1 IMPLANT
DRAIN CHANNEL 19F RND (DRAIN) IMPLANT
DRAPE C-ARM 42X120 X-RAY (DRAPES) IMPLANT
ELECT REM PT RETURN 15FT ADLT (MISCELLANEOUS) ×1 IMPLANT
ENDOLOOP SUT PDS II 0 18 (SUTURE) IMPLANT
EVACUATOR SILICONE 100CC (DRAIN) IMPLANT
GLOVE BIOGEL PI IND STRL 7.0 (GLOVE) ×1 IMPLANT
GLOVE SURG SS PI 7.0 STRL IVOR (GLOVE) ×1 IMPLANT
GOWN STRL REUS W/ TWL LRG LVL3 (GOWN DISPOSABLE) ×1 IMPLANT
GRASPER SUT TROCAR 14GX15 (MISCELLANEOUS) IMPLANT
IRRIGATION SUCT STRKRFLW 2 WTP (MISCELLANEOUS) ×1 IMPLANT
KIT BASIN OR (CUSTOM PROCEDURE TRAY) ×1 IMPLANT
KIT TURNOVER KIT A (KITS) ×1 IMPLANT
NDL HYPO 22X1.5 SAFETY MO (MISCELLANEOUS) ×1 IMPLANT
NEEDLE HYPO 22X1.5 SAFETY MO (MISCELLANEOUS) ×1 IMPLANT
POUCH RETRIEVAL ECOSAC 10 (ENDOMECHANICALS) ×1 IMPLANT
SCISSORS LAP 5X35 DISP (ENDOMECHANICALS) ×1 IMPLANT
SET CHOLANGIOGRAPH MIX (MISCELLANEOUS) IMPLANT
SET TUBE SMOKE EVAC HIGH FLOW (TUBING) ×1 IMPLANT
SLEEVE Z-THREAD 5X100MM (TROCAR) ×2 IMPLANT
SPIKE FLUID TRANSFER (MISCELLANEOUS) ×1 IMPLANT
STRIP CLOSURE SKIN 1/2X4 (GAUZE/BANDAGES/DRESSINGS) IMPLANT
SUT ETHILON 2 0 PS N (SUTURE) IMPLANT
SUT MNCRL AB 4-0 PS2 18 (SUTURE) ×1 IMPLANT
SUT VICRYL 0 UR6 27IN ABS (SUTURE) IMPLANT
TOWEL OR 17X26 10 PK STRL BLUE (TOWEL DISPOSABLE) ×1 IMPLANT
TRAY LAPAROSCOPIC (CUSTOM PROCEDURE TRAY) ×1 IMPLANT
TROCAR Z THREAD OPTICAL 12X100 (TROCAR) ×1 IMPLANT
TROCAR Z-THREAD OPTICAL 5X100M (TROCAR) ×1 IMPLANT

## 2024-01-15 NOTE — Transfer of Care (Signed)
 Immediate Anesthesia Transfer of Care Note  Patient: Caitlin Anthony  Procedure(s) Performed: LAPAROSCOPIC CHOLECYSTECTOMY WITH INTRAOPERATIVE CHOLANGIOGRAM  Patient Location: PACU  Anesthesia Type:General  Level of Consciousness: awake and alert   Airway & Oxygen Therapy: Patient Spontanous Breathing and Patient connected to nasal cannula oxygen  Post-op Assessment: Report given to RN and Post -op Vital signs reviewed and stable  Post vital signs: Reviewed and stable  Last Vitals:  Vitals Value Taken Time  BP    Temp    Pulse 92 01/15/24 09:26  Resp 14 01/15/24 09:26  SpO2 100 % 01/15/24 09:26  Vitals shown include unfiled device data.  Last Pain:  Vitals:   01/15/24 0645  TempSrc: Oral  PainSc: 4          Complications: No notable events documented.

## 2024-01-15 NOTE — ED Notes (Signed)
-  Called [facility] to set up transporation to [facility] Pre-Op in AM. Spoke with Gen Surg A.Thomas to verify what time patient needed to be at pre-op, she stated that the patient can be there at 730 but if unable to get there by 730 then 8 or 9 is fine too. Patient will be first on list to be transported after shift change at 7am.

## 2024-01-15 NOTE — Anesthesia Preprocedure Evaluation (Signed)
 Anesthesia Evaluation  Patient identified by MRN, date of birth, ID band Patient awake    Reviewed: Allergy & Precautions, H&P , NPO status , Patient's Chart, lab work & pertinent test results  History of Anesthesia Complications Negative for: history of anesthetic complications  Airway Mallampati: II  TM Distance: >3 FB Neck ROM: Full    Dental no notable dental hx.    Pulmonary asthma    Pulmonary exam normal breath sounds clear to auscultation       Cardiovascular negative cardio ROS Normal cardiovascular exam Rhythm:Regular Rate:Normal     Neuro/Psych negative neurological ROS  negative psych ROS   GI/Hepatic Neg liver ROS,,,Crohns  Cholelithiasis   Endo/Other    Class 4 obesity  Renal/GU negative Renal ROS  negative genitourinary   Musculoskeletal negative musculoskeletal ROS (+)    Abdominal  (+) + obese  Peds negative pediatric ROS (+)  Hematology negative hematology ROS (+)   Anesthesia Other Findings   Reproductive/Obstetrics negative OB ROS                             Anesthesia Physical Anesthesia Plan  ASA: 3  Anesthesia Plan: General   Post-op Pain Management:    Induction: Intravenous  PONV Risk Score and Plan: 3 and Ondansetron , Dexamethasone, Midazolam and Treatment may vary due to age or medical condition  Airway Management Planned: Oral ETT  Additional Equipment: None  Intra-op Plan:   Post-operative Plan: Extubation in OR  Informed Consent: I have reviewed the patients History and Physical, chart, labs and discussed the procedure including the risks, benefits and alternatives for the proposed anesthesia with the patient or authorized representative who has indicated his/her understanding and acceptance.     Dental advisory given  Plan Discussed with: CRNA  Anesthesia Plan Comments:        Anesthesia Quick Evaluation

## 2024-01-15 NOTE — Discharge Instructions (Signed)

## 2024-01-15 NOTE — Anesthesia Procedure Notes (Signed)
 Procedure Name: Intubation Date/Time: 01/15/2024 8:29 AM  Performed by: Hunter Maha, CRNAPre-anesthesia Checklist: Patient identified, Emergency Drugs available, Suction available and Patient being monitored Patient Re-evaluated:Patient Re-evaluated prior to induction Oxygen Delivery Method: Circle system utilized Preoxygenation: Pre-oxygenation with 100% oxygen Induction Type: IV induction Ventilation: Mask ventilation without difficulty Laryngoscope Size: Glidescope and 3 Grade View: Grade I Tube type: Oral Tube size: 7.5 mm Number of attempts: 1 Airway Equipment and Method: Stylet and Oral airway Placement Confirmation: ETT inserted through vocal cords under direct vision, positive ETCO2 and breath sounds checked- equal and bilateral Secured at: 23 cm Tube secured with: Tape Dental Injury: Teeth and Oropharynx as per pre-operative assessment

## 2024-01-15 NOTE — Anesthesia Postprocedure Evaluation (Signed)
 Anesthesia Post Note  Patient: Caitlin Anthony  Procedure(s) Performed: LAPAROSCOPIC CHOLECYSTECTOMY WITH INTRAOPERATIVE CHOLANGIOGRAM     Patient location during evaluation: PACU Anesthesia Type: General Level of consciousness: awake and alert Pain management: pain level controlled Vital Signs Assessment: post-procedure vital signs reviewed and stable Respiratory status: spontaneous breathing, nonlabored ventilation, respiratory function stable and patient connected to nasal cannula oxygen Cardiovascular status: blood pressure returned to baseline and stable Postop Assessment: no apparent nausea or vomiting Anesthetic complications: no   No notable events documented.  Last Vitals:  Vitals:   01/15/24 1127 01/15/24 1130  BP: (!) 142/87 130/79  Pulse: 80 74  Resp:    Temp: 36.9 C   SpO2: 100% 100%    Last Pain:  Vitals:   01/15/24 1127  TempSrc: (P) Oral  PainSc: 4                  Lethaniel Rave

## 2024-01-15 NOTE — H&P (Signed)
 Reason for Consult: abdominal pain Referring Provider: Alveda Jing  Caitlin Anthony is an 23 y.o. female.  HPI: 23 yo female with abodminal pain for 2 days. Pain is epigastric. It is constant. It is worse with movement and standing. She has some nausea. She has never had a pain like this in the past.  Past Medical History:  Diagnosis Date   Asthma    Crohn's disease (HCC)     Past Surgical History:  Procedure Laterality Date   NO PAST SURGERIES      Family History  Problem Relation Age of Onset   Asthma Other    Diabetes Other     Social History:  reports that she has never smoked. She has never used smokeless tobacco. She reports that she does not drink alcohol and does not use drugs.  Allergies:  Allergies  Allergen Reactions   Hydrocodone Itching and Rash   Pineapple Swelling    Also hives    Medications: I have reviewed the patient's current medications.  Results for orders placed or performed during the hospital encounter of 01/14/24 (from the past 48 hours)  Lipase, blood     Status: None   Collection Time: 01/14/24  7:36 PM  Result Value Ref Range   Lipase 23 11 - 51 U/L    Comment: Performed at [facility], [address] [place], [place] [address]  Comprehensive metabolic panel     Status: Abnormal   Collection Time: 01/14/24  7:36 PM  Result Value Ref Range   Sodium 138 135 - 145 mmol/L   Potassium 3.8 3.5 - 5.1 mmol/L   Chloride 104 98 - 111 mmol/L   CO2 21 (L) 22 - 32 mmol/L   Glucose, Bld 92 70 - 99 mg/dL    Comment: Glucose reference range applies only to samples taken after fasting for at least 8 hours.   BUN 11 6 - 20 mg/dL   Creatinine, Ser 6.04 0.44 - 1.00 mg/dL   Calcium 9.2 8.9 - 54.0 mg/dL   Total Protein 8.3 (H) 6.5 - 8.1 g/dL   Albumin 4.2 3.5 - 5.0 g/dL   AST 981 (H) 15 - 41 U/L   ALT 86 (H) 0 - 44 U/L   Alkaline Phosphatase 102 38 - 126 U/L   Total Bilirubin 0.8 0.0 - 1.2 mg/dL   GFR, Estimated >19 >14 mL/min     Comment: (NOTE) Calculated using the CKD-EPI Creatinine Equation (2021)    Anion gap 13 5 - 15    Comment: Performed at [facility], [address] [place], [place] [address]  CBC     Status: Abnormal   Collection Time: 01/14/24  7:36 PM  Result Value Ref Range   WBC 4.9 4.0 - 10.5 K/uL   RBC 5.01 3.87 - 5.11 MIL/uL   Hemoglobin 10.8 (L) 12.0 - 15.0 g/dL   HCT 62.1 (L) 30.8 - 65.7 %   MCV 70.1 (L) 80.0 - 100.0 fL   MCH 21.6 (L) 26.0 - 34.0 pg   MCHC 30.8 30.0 - 36.0 g/dL   RDW 84.6 (H) 96.2 - 95.2 %   Platelets 430 (H) 150 - 400 K/uL   nRBC 0.0 0.0 - 0.2 %    Comment: Performed at [facility], [address] [place], [place] [address]  Urinalysis, Routine w reflex microscopic -Urine, Clean Catch     Status: Abnormal   Collection Time: 01/14/24  7:36 PM  Result Value Ref Range   Color, Urine YELLOW YELLOW   APPearance HAZY (A) CLEAR   Specific Gravity, Urine 1.025 1.005 - 1.030   pH 6.5 5.0 - 8.0   Glucose, UA NEGATIVE NEGATIVE mg/dL   Hgb urine dipstick NEGATIVE NEGATIVE   Bilirubin Urine SMALL (A) NEGATIVE   Ketones, ur NEGATIVE NEGATIVE mg/dL   Protein, ur 30 (A) NEGATIVE mg/dL   Nitrite NEGATIVE NEGATIVE   Leukocytes,Ua NEGATIVE NEGATIVE    Comment: Performed at [facility], [address] [place], [place] [address]  Urinalysis, Microscopic (reflex)     Status: Abnormal   Collection Time: 01/14/24  7:36 PM  Result Value Ref Range   RBC / HPF 0-5 0 - 5 RBC/hpf   WBC, UA 0-5 0 - 5 WBC/hpf   Bacteria, UA FEW (A) NONE SEEN   Squamous Epithelial / HPF 6-10 0 - 5 /HPF   Mucus PRESENT     Comment: Performed at [facility], [address] [place], [place] [address]  Troponin T, High Sensitivity     Status: None   Collection Time: 01/14/24  8:57 PM  Result Value Ref Range   Troponin T High Sensitivity <15 <19 ng/L    Comment: (NOTE) Biotin concentrations > 1000 ng/mL falsely decrease TnT results.  Serial cardiac troponin  measurements are suggested.  Refer to the Links section for chest pain algorithms and additional  guidance. Performed at [facility], [address] [place], [place] [address]     PE Blood pressure (!) 148/91, pulse 83, temperature 99.3 F (37.4 C), temperature source Oral, resp. rate 16, height 5' 4 (1.626 m), weight (!) 158.8 kg, last menstrual period 01/11/2024, SpO2 100%, not currently breastfeeding. Constitutional: NAD; conversant; no deformities Eyes: Moist conjunctiva; no lid lag; anicteric; PERRL Neck: Trachea midline; no thyromegaly Lungs: Normal respiratory effort; no tactile fremitus CV: RRR; no palpable thrills; no pitting edema GI: Abd tender in epigastrium; no palpable hepatosplenomegaly MSK: Normal gait; no clubbing/cyanosis Psychiatric: Appropriate affect; alert and oriented x3 Lymphatic: No palpable cervical or axillary lymphadenopathy Skin: No major subcutaneous nodules. Warm and dry   Assessment/Plan: 23 yo female with acute cholecystitis, dilated CBD. No leukocytosis. Slight AST/ALT elevation but no bilirubin elevation consistent with cholecystitis. -IV abx -OR for lap chole w ioc -We discussed the etiology of her pain, we discussed treatment options and recommended surgery. We discussed details of surgery including general anesthesia, laparoscopic approach, identification of cystic duct and common bile duct. Ligation of cystic duct and cystic artery. Possible need for intraoperative cholangiogram or open procedure. Possible risks of common bile duct injury, liver injury, cystic duct leak, bleeding, infection, post-cholecystectomy syndrome. The patient showed good understanding and all questions were answered   I reviewed last 24 h vitals and pain scores, last 48 h intake and output, last 24 h labs and trends, and last 24 h imaging results.  This care required high  level of medical decision making.   Caitlin Anthony 01/15/2024, 7:10 AM

## 2024-01-15 NOTE — ED Notes (Signed)
-  [facility] called, enroute to pick up patient for transportation to [facility] Pre-op.

## 2024-01-15 NOTE — Op Note (Signed)
 PATIENT:  Caitlin Anthony  23 y.o. female  PRE-OPERATIVE DIAGNOSIS:  CHOLELITHIASIS  POST-OPERATIVE DIAGNOSIS:  CHOLELITHIASIS  PROCEDURE:  Procedure(s): LAPAROSCOPIC CHOLECYSTECTOMY WITH INTRAOPERATIVE CHOLANGIOGRAM   SURGEON:  Gerrard Crystal, Alphonso Aschoff, MD   ASSISTANT: none  ANESTHESIA:   local and general  Indications for procedure: [name] is a 23 y.o. female with symptoms of Abdominal pain consistent with gallbladder disease, Confirmed by ultrasound.  Description of procedure: The patient was brought into the operative suite, placed supine. Anesthesia was administered with endotracheal tube. Patient was strapped in place and foot board was secured. All pressure points were offloaded by foam padding. The patient was prepped and draped in the usual sterile fashion.  A periumbilical incision was made and optical entry was used to enter the abdomen. 2 5 mm trocars were placed on in the right lateral space on in the right subcostal space. A 12mm trocar was placed in the subxiphoid space. Marcaine was infused to the subxiphoid space and lateral upper right abdomen in the transversus abdominis plane. Next the patient was placed in reverse trendelenberg. The gallbladder appearedchronically inflamed. Omentum was adhered to the gallbladder and was taken down with cautery/blunt dissection.  The gallbladder was retracted cephalad and lateral. The peritoneum was reflected off the infundibulum working lateral to medial. The cystic duct and cystic artery were identified and further dissection revealed a critical view, due to concern for choledocholithiasis a cholangiogram was performed with ductotomy and cook catheter passed through a separate subcostal stab incision. No stones were seen in the duct and contrast emptied into the duodenum. The cystic duct and cystic artery were doubly clipped and ligated.   The gallbladder was removed off the liver bed with cautery. The Gallbladder was placed in a specimen  bag. The gallbladder fossa was irrigated and hemostasis was applied with cautery. The gallbladder was removed via the 12mm trocar. The fascial defect was closed with interrupted 0 vicryl suture via laparoscopic trans-fascial suture passer. Pneumoperitoneum was removed, all trocar were removed. All incisions were closed with 4-0 monocryl subcuticular stitch. The patient woke from anesthesia and was brought to PACU in stable condition. All counts were correct  Findings: chronic cholecystitis  Specimen: gallbladder  Blood loss: 20 ml  Local anesthesia: 30 ml Marcaine  Complications: none  PLAN OF CARE: Discharge to home after PACU  PATIENT DISPOSITION:  PACU - hemodynamically stable.  Alphonso Aschoff [facility], [place]

## 2024-01-16 ENCOUNTER — Other Ambulatory Visit: Payer: Self-pay

## 2024-01-16 ENCOUNTER — Emergency Department (HOSPITAL_COMMUNITY)
Admission: EM | Admit: 2024-01-16 | Discharge: 2024-01-16 | Disposition: A | Discharge status: Home / Self Care | Attending: Emergency Medicine | Admitting: Emergency Medicine

## 2024-01-16 ENCOUNTER — Emergency Department (HOSPITAL_COMMUNITY)

## 2024-01-16 ENCOUNTER — Encounter (HOSPITAL_COMMUNITY): Payer: Self-pay | Admitting: General Surgery

## 2024-01-16 DIAGNOSIS — R1084 Generalized abdominal pain: Secondary | ICD-10-CM | POA: Insufficient documentation

## 2024-01-16 DIAGNOSIS — G8918 Other acute postprocedural pain: Secondary | ICD-10-CM | POA: Diagnosis present

## 2024-01-16 LAB — CBC WITH DIFFERENTIAL/PLATELET
Abs Immature Granulocytes: 0.03 10*3/uL (ref 0.00–0.07)
Basophils Absolute: 0 10*3/uL (ref 0.0–0.1)
Basophils Relative: 0 %
Eosinophils Absolute: 0 10*3/uL (ref 0.0–0.5)
Eosinophils Relative: 0 %
HCT: 35.6 % — ABNORMAL LOW (ref 36.0–46.0)
Hemoglobin: 10.6 g/dL — ABNORMAL LOW (ref 12.0–15.0)
Immature Granulocytes: 0 %
Lymphocytes Relative: 14 %
Lymphs Abs: 1.2 10*3/uL (ref 0.7–4.0)
MCH: 21.7 pg — ABNORMAL LOW (ref 26.0–34.0)
MCHC: 29.8 g/dL — ABNORMAL LOW (ref 30.0–36.0)
MCV: 73 fL — ABNORMAL LOW (ref 80.0–100.0)
Monocytes Absolute: 0.6 10*3/uL (ref 0.1–1.0)
Monocytes Relative: 7 %
Neutro Abs: 6.8 10*3/uL (ref 1.7–7.7)
Neutrophils Relative %: 79 %
Platelets: 403 10*3/uL — ABNORMAL HIGH (ref 150–400)
RBC: 4.88 MIL/uL (ref 3.87–5.11)
RDW: 16.7 % — ABNORMAL HIGH (ref 11.5–15.5)
WBC: 8.5 10*3/uL (ref 4.0–10.5)
nRBC: 0 % (ref 0.0–0.2)

## 2024-01-16 LAB — URINALYSIS, ROUTINE W REFLEX MICROSCOPIC
Bilirubin Urine: NEGATIVE
Glucose, UA: NEGATIVE mg/dL
Hgb urine dipstick: NEGATIVE
Ketones, ur: NEGATIVE mg/dL
Leukocytes,Ua: NEGATIVE
Nitrite: NEGATIVE
Protein, ur: NEGATIVE mg/dL
Specific Gravity, Urine: 1.028 (ref 1.005–1.030)
pH: 6 (ref 5.0–8.0)

## 2024-01-16 LAB — COMPREHENSIVE METABOLIC PANEL WITH GFR
ALT: 168 U/L — ABNORMAL HIGH (ref 0–44)
AST: 131 U/L — ABNORMAL HIGH (ref 15–41)
Albumin: 3.4 g/dL — ABNORMAL LOW (ref 3.5–5.0)
Alkaline Phosphatase: 88 U/L (ref 38–126)
Anion gap: 9 (ref 5–15)
BUN: 7 mg/dL (ref 6–20)
CO2: 21 mmol/L — ABNORMAL LOW (ref 22–32)
Calcium: 9.1 mg/dL (ref 8.9–10.3)
Chloride: 106 mmol/L (ref 98–111)
Creatinine, Ser: 0.68 mg/dL (ref 0.44–1.00)
GFR, Estimated: >60 mL/min (ref >60)
Glucose, Bld: 115 mg/dL — ABNORMAL HIGH (ref 70–99)
Potassium: 3.6 mmol/L (ref 3.5–5.1)
Sodium: 136 mmol/L (ref 135–145)
Total Bilirubin: 0.5 mg/dL (ref 0.0–1.2)
Total Protein: 7.8 g/dL (ref 6.5–8.1)

## 2024-01-16 LAB — HCG, SERUM, QUALITATIVE: Preg, Serum: NEGATIVE

## 2024-01-16 LAB — POC URINE PREG, ED: Preg Test, Ur: NEGATIVE

## 2024-01-16 MED ORDER — ONDANSETRON HCL 4 MG/2ML IJ SOLN
4.0000 mg | Freq: Once | INTRAMUSCULAR | Status: AC
  Administered 2024-01-16: 4 mg via INTRAVENOUS
  Filled 2024-01-16: qty 2

## 2024-01-16 MED ORDER — METHOCARBAMOL 500 MG PO TABS
1000.0000 mg | ORAL_TABLET | Freq: Three times a day (TID) | ORAL | Status: DC
  Administered 2024-01-16: 1000 mg via ORAL
  Filled 2024-01-16: qty 2

## 2024-01-16 MED ORDER — IOHEXOL 350 MG/ML SOLN
100.0000 mL | Freq: Once | INTRAVENOUS | Status: AC | PRN
  Administered 2024-01-16: 100 mL via INTRAVENOUS

## 2024-01-16 MED ORDER — IOHEXOL 350 MG/ML SOLN: 75.0000 mL | Freq: Once | INTRAVENOUS | Status: DC | PRN

## 2024-01-16 MED ORDER — DICYCLOMINE HCL 20 MG PO TABS: 20.0000 mg | ORAL_TABLET | Freq: Two times a day (BID) | ORAL | 0 refills | Status: AC

## 2024-01-16 MED ORDER — FENTANYL CITRATE PF 50 MCG/ML IJ SOSY
100.0000 ug | PREFILLED_SYRINGE | Freq: Once | INTRAMUSCULAR | Status: AC
  Administered 2024-01-16: 100 ug via INTRAVENOUS
  Filled 2024-01-16: qty 2

## 2024-01-16 MED ORDER — KETOROLAC TROMETHAMINE 15 MG/ML IJ SOLN
30.0000 mg | Freq: Four times a day (QID) | INTRAMUSCULAR | Status: DC
  Administered 2024-01-16: 30 mg via INTRAVENOUS
  Filled 2024-01-16 (×2): qty 2

## 2024-01-16 MED ORDER — ACETAMINOPHEN 500 MG PO TABS
1000.0000 mg | ORAL_TABLET | Freq: Four times a day (QID) | ORAL | Status: DC
  Administered 2024-01-16: 1000 mg via ORAL
  Filled 2024-01-16: qty 2

## 2024-01-16 MED ORDER — OXYCODONE HCL 5 MG PO TABS: 5.0000 mg | ORAL_TABLET | ORAL | Status: DC | PRN

## 2024-01-16 MED ORDER — METHOCARBAMOL 500 MG PO TABS: 500.0000 mg | ORAL_TABLET | Freq: Two times a day (BID) | ORAL | 0 refills | Status: DC

## 2024-01-16 MED ORDER — ONDANSETRON 8 MG PO TBDP: ORAL_TABLET | ORAL | 0 refills | Status: AC

## 2024-01-16 MED ORDER — SODIUM CHLORIDE 0.9 % IV SOLN: INTRAVENOUS | Status: DC

## 2024-01-16 NOTE — ED Provider Notes (Signed)
 [place] EMERGENCY DEPARTMENT AT [facility] Provider Note   CSN: [id number] Arrival date & time: 01/16/24  0155     Patient presents with: Post-op Problem   Caitlin Anthony is a 23 y.o. female.   The history is provided by the patient.  Abdominal Pain Pain location:  Generalized Pain radiates to:  Does not radiate Pain severity:  Severe Onset quality:  Sudden Duration: post-operative from gall bladder removal earlier today. Timing:  Constant Progression:  Unchanged Chronicity:  New Context: not recent illness and not recent travel   Relieved by:  Nothing Worsened by:  Nothing Ineffective treatments:  None tried Associated symptoms: no chest pain, no cough and no shortness of breath   Risk factors: recent hospitalization   Risk factors: no alcohol abuse   POD 0 from gall bladder removal with intractable pain.       Prior to Admission medications   Medication Sig Start Date End Date Taking? Authorizing Provider  dicyclomine (BENTYL) 20 MG tablet Take 1 tablet (20 mg total) by mouth 2 (two) times daily. 01/16/24  Yes Siya Flurry, MD  methocarbamol  (ROBAXIN ) 500 MG tablet Take 1 tablet (500 mg total) by mouth 2 (two) times daily. 01/16/24  Yes Mairen Wallenstein, MD  acetaminophen  (TYLENOL ) 325 MG tablet Take 2 tablets (650 mg total) by mouth every 6 (six) hours as needed for up to 30 doses. 01/31/22   Arvilla Birmingham, MD  albuterol  (VENTOLIN  HFA) 108 (90 Base) MCG/ACT inhaler Inhale 2 puffs into the lungs every 4 (four) hours as needed for wheezing or shortness of breath (cough). 09/01/23   Charmayne Cooper, MD  bacitracin  ointment Apply 1 Application topically 2 (two) times daily. 12/05/22   Horton, Vonzella Guernsey, MD  buPROPion (WELLBUTRIN XL) 300 MG 24 hr tablet Take 300 mg by mouth daily.    [provider]  busPIRone (BUSPAR) 10 MG tablet Take 10 mg by mouth 3 (three) times daily.    [provider]  ibuprofen  (ADVIL ) 800 MG tablet Take 1 tablet (800  mg total) by mouth every 8 (eight) hours as needed. 01/15/24   Kinsinger, Alphonso Aschoff, MD  methocarbamol  (ROBAXIN ) 500 MG tablet Take 1 tablet (500 mg total) by mouth every 8 (eight) hours as needed for muscle spasms. 09/06/23   Long, Shereen Dike, MD  oxyCODONE  (OXY IR/ROXICODONE ) 5 MG immediate release tablet Take 1 tablet (5 mg total) by mouth every 6 (six) hours as needed for severe pain (pain score 7-10). 01/15/24   Kinsinger, Alphonso Aschoff, MD    Allergies: Hydrocodone and Pineapple    Review of Systems  HENT:  Negative for facial swelling.   Respiratory:  Negative for cough, shortness of breath, wheezing and stridor.   Cardiovascular:  Negative for chest pain.  Gastrointestinal:  Positive for abdominal pain.  All other systems reviewed and are negative.   Updated Vital Signs BP 120/68   Pulse 67   Temp 98.5 F (36.9 C)   Resp 18   Ht 5' 4 (1.626 m)   Wt (!) 158.8 kg   LMP 01/11/2024 Comment: negative urine preg as of 01/14/24  SpO2 99%   BMI 60.09 kg/m   Physical Exam Vitals and nursing note reviewed.  Constitutional:      General: She is not in acute distress.    Appearance: Normal appearance. She is well-developed.  HENT:     Head: Normocephalic and atraumatic.     Nose: Nose normal.   Eyes:  Pupils: Pupils are equal, round, and reactive to light.    Cardiovascular:     Rate and Rhythm: Normal rate and regular rhythm.     Pulses: Normal pulses.     Heart sounds: Normal heart sounds.  Pulmonary:     Effort: Pulmonary effort is normal. No respiratory distress.     Breath sounds: Normal breath sounds. No stridor. No wheezing, rhonchi or rales.  Chest:     Chest wall: No tenderness.  Abdominal:     General: Bowel sounds are normal. There is no distension.     Palpations: Abdomen is soft.     Tenderness: There is no abdominal tenderness. There is no guarding or rebound.   Musculoskeletal:        General: Normal range of motion.     Cervical back: Normal range of  motion and neck supple.   Skin:    General: Skin is warm and dry.     Capillary Refill: Capillary refill takes less than 2 seconds.     Findings: No erythema or rash.   Neurological:     General: No focal deficit present.     Mental Status: She is alert.     Deep Tendon Reflexes: Reflexes normal.   Psychiatric:        Mood and Affect: Mood normal.     (all labs ordered are listed, but only abnormal results are displayed) Results for orders placed or performed during the hospital encounter of 01/16/24  CBC with Differential   Collection Time: 01/16/24  3:27 AM  Result Value Ref Range   WBC 8.5 4.0 - 10.5 K/uL   RBC 4.88 3.87 - 5.11 MIL/uL   Hemoglobin 10.6 (L) 12.0 - 15.0 g/dL   HCT 78.2 (L) 95.6 - 21.3 %   MCV 73.0 (L) 80.0 - 100.0 fL   MCH 21.7 (L) 26.0 - 34.0 pg   MCHC 29.8 (L) 30.0 - 36.0 g/dL   RDW 08.6 (H) 57.8 - 46.9 %   Platelets 403 (H) 150 - 400 K/uL   nRBC 0.0 0.0 - 0.2 %   Neutrophils Relative % 79 %   Neutro Abs 6.8 1.7 - 7.7 K/uL   Lymphocytes Relative 14 %   Lymphs Abs 1.2 0.7 - 4.0 K/uL   Monocytes Relative 7 %   Monocytes Absolute 0.6 0.1 - 1.0 K/uL   Eosinophils Relative 0 %   Eosinophils Absolute 0.0 0.0 - 0.5 K/uL   Basophils Relative 0 %   Basophils Absolute 0.0 0.0 - 0.1 K/uL   Immature Granulocytes 0 %   Abs Immature Granulocytes 0.03 0.00 - 0.07 K/uL  Comprehensive metabolic panel   Collection Time: 01/16/24  3:27 AM  Result Value Ref Range   Sodium 136 135 - 145 mmol/L   Potassium 3.6 3.5 - 5.1 mmol/L   Chloride 106 98 - 111 mmol/L   CO2 21 (L) 22 - 32 mmol/L   Glucose, Bld 115 (H) 70 - 99 mg/dL   BUN 7 6 - 20 mg/dL   Creatinine, Ser 6.29 0.44 - 1.00 mg/dL   Calcium 9.1 8.9 - 52.8 mg/dL   Total Protein 7.8 6.5 - 8.1 g/dL   Albumin 3.4 (L) 3.5 - 5.0 g/dL   AST 413 (H) 15 - 41 U/L   ALT 168 (H) 0 - 44 U/L   Alkaline Phosphatase 88 38 - 126 U/L   Total Bilirubin 0.5 0.0 - 1.2 mg/dL   GFR, Estimated >24 >40 mL/min   Anion  gap 9 5 - 15   hCG, serum, qualitative   Collection Time: 01/16/24  3:27 AM  Result Value Ref Range   Preg, Serum NEGATIVE NEGATIVE  Urinalysis, Routine w reflex microscopic -Urine, Clean Catch   Collection Time: 01/16/24  3:45 AM  Result Value Ref Range   Color, Urine YELLOW YELLOW   APPearance HAZY (A) CLEAR   Specific Gravity, Urine 1.028 1.005 - 1.030   pH 6.0 5.0 - 8.0   Glucose, UA NEGATIVE NEGATIVE mg/dL   Hgb urine dipstick NEGATIVE NEGATIVE   Bilirubin Urine NEGATIVE NEGATIVE   Ketones, ur NEGATIVE NEGATIVE mg/dL   Protein, ur NEGATIVE NEGATIVE mg/dL   Nitrite NEGATIVE NEGATIVE   Leukocytes,Ua NEGATIVE NEGATIVE   RBC / HPF 0-5 0 - 5 RBC/hpf   WBC, UA 0-5 0 - 5 WBC/hpf   Bacteria, UA RARE (A) NONE SEEN   Squamous Epithelial / HPF 11-20 0 - 5 /HPF   Mucus PRESENT   POC Urine Pregnancy, ED (not at [facility] or [facility])   Collection Time: 01/16/24  4:01 AM  Result Value Ref Range   Preg Test, Ur NEGATIVE NEGATIVE   CT ABDOMEN PELVIS W CONTRAST Result Date: 01/16/2024 EXAM: CT ABDOMEN AND PELVIS WITH CONTRAST 01/16/2024 04:31:36 AM TECHNIQUE: CT of the abdomen and pelvis was performed with the administration of intravenous contrast (100mL iohexol  (OMNIPAQUE ) 350 MG/ML injection). Multiplanar reformatted images are provided for review. Automated exposure control, iterative reconstruction, and/or weight based adjustment of the mA/kV was utilized to reduce the radiation dose to as low as reasonably achievable. COMPARISON: CT abdomen and pelvis with contrast 07/22/2023 and right upper quadrant ultrasound 01/14/2024. CLINICAL HISTORY: Abdominal pain, acute, nonlocalized. Status post cholecystectomy. FINDINGS: LOWER CHEST: No acute abnormality. LIVER: The liver is unremarkable. GALLBLADDER AND BILE DUCTS: Cholecystectomy is noted. Mild stranding and minimal gas are present in the gallbladder fossa, typical following surgery. No formed fluid collection is present. SPLEEN: No acute abnormality. PANCREAS: No acute  abnormality. ADRENAL GLANDS: No acute abnormality. KIDNEYS, URETERS AND BLADDER: No stones in the kidneys or ureters. No hydronephrosis. No perinephric or periureteral stranding. Urinary bladder is unremarkable. GI AND BOWEL: Stomach demonstrates no acute abnormality. There is no bowel obstruction. No bowel wall thickening. PERITONEUM AND RETROPERITONEUM: Minimal pneumoperitoneum remains. A small amount of free fluid is present within the anatomic pelvis. VASCULATURE: Aorta is normal in caliber. LYMPH NODES: No lymphadenopathy. REPRODUCTIVE ORGANS: No acute abnormality. BONES AND SOFT TISSUES: Gas is present within the subcutaneous soft tissues and in the right abdominal musculature. No acute osseous abnormality. IMPRESSION: 1. Status post cholecystectomy with mild stranding and minimal gas in the gallbladder fossa, typical following surgery. No formed fluid collection. 2. Gas in the subcutaneous soft tissues and right abdominal musculature, and minimal pneumoperitoneum, also within normal limits following surgery . 3. Small amount of free fluid in the anatomic pelvis, as expected . Electronically signed by: Audree Leas MD 01/16/2024 04:49 AM EDT RP Workstation: [id number]   DG Cholangiogram Operative Result Date: 01/15/2024 CLINICAL DATA:  Cholecystectomy. EXAM: INTRAOPERATIVE CHOLANGIOGRAM TECHNIQUE: Cholangiographic images from the C-arm fluoroscopic device were submitted for interpretation post-operatively. Please see the procedural report for the amount of contrast and the fluoroscopy time utilized. FLUOROSCOPY: Radiation Exposure Index (as provided by the fluoroscopic device): 22.1 mGy Kerma COMPARISON:  Limited right upper quadrant abdominal ultrasound dated 01/14/2024 FINDINGS: C-arm cine sequence of the right upper quadrant of the abdomen demonstrates contrast injection into the cystic duct remnant with some extraluminal spread of contrast as  well as contrast filling the cystic duct remnant,  common duct and intrahepatic ducts. There is free flow of contrast into the duodenum. No biliary ductal filling defects are seen. IMPRESSION: No retained ductal gallstones seen. Electronically Signed   By: Catherin Closs M.D.   On: 01/15/2024 12:54   US  Abdomen Limited RUQ (LIVER/GB) Result Date: 01/14/2024 CLINICAL DATA:  Epigastric/right upper quadrant pain since this morning. Increase in LFTs EXAM: ULTRASOUND ABDOMEN LIMITED RIGHT UPPER QUADRANT COMPARISON:  CT abdomen pelvis 07/22/2023 FINDINGS: Gallbladder: Many small gallstones. No wall thickening or pericholecystic fluid. Positive sonographic Murphy sign noted by sonographer. Common bile duct: Diameter: 6 mm. Liver: No focal lesion identified. Within normal limits in parenchymal echogenicity. Portal vein is patent on color Doppler imaging with normal direction of blood flow towards the liver. Other: None. IMPRESSION: Cholelithiasis with positive sonographic Murphy sign but no wall thickening or pericholecystic fluid. Findings suspicious for early acute cholecystitis. Consider HIDA scan for confirmation. Electronically Signed   By: Rozell Cornet M.D.   On: 01/14/2024 21:27   DG Chest 2 View Result Date: 01/14/2024 CLINICAL DATA:  chest pain EXAM: CHEST - 2 VIEW COMPARISON:  None available. FINDINGS: No focal airspace consolidation, pleural effusion, or pneumothorax. No cardiomegaly. No acute fracture or destructive lesion. IMPRESSION: No acute cardiopulmonary abnormality. Electronically Signed   By: Rance Burrows M.D.   On: 01/14/2024 08:45    Radiology: CT ABDOMEN PELVIS W CONTRAST Result Date: 01/16/2024 EXAM: CT ABDOMEN AND PELVIS WITH CONTRAST 01/16/2024 04:31:36 AM TECHNIQUE: CT of the abdomen and pelvis was performed with the administration of intravenous contrast (100mL iohexol  (OMNIPAQUE ) 350 MG/ML injection). Multiplanar reformatted images are provided for review. Automated exposure control, iterative reconstruction, and/or weight based  adjustment of the mA/kV was utilized to reduce the radiation dose to as low as reasonably achievable. COMPARISON: CT abdomen and pelvis with contrast 07/22/2023 and right upper quadrant ultrasound 01/14/2024. CLINICAL HISTORY: Abdominal pain, acute, nonlocalized. Status post cholecystectomy. FINDINGS: LOWER CHEST: No acute abnormality. LIVER: The liver is unremarkable. GALLBLADDER AND BILE DUCTS: Cholecystectomy is noted. Mild stranding and minimal gas are present in the gallbladder fossa, typical following surgery. No formed fluid collection is present. SPLEEN: No acute abnormality. PANCREAS: No acute abnormality. ADRENAL GLANDS: No acute abnormality. KIDNEYS, URETERS AND BLADDER: No stones in the kidneys or ureters. No hydronephrosis. No perinephric or periureteral stranding. Urinary bladder is unremarkable. GI AND BOWEL: Stomach demonstrates no acute abnormality. There is no bowel obstruction. No bowel wall thickening. PERITONEUM AND RETROPERITONEUM: Minimal pneumoperitoneum remains. A small amount of free fluid is present within the anatomic pelvis. VASCULATURE: Aorta is normal in caliber. LYMPH NODES: No lymphadenopathy. REPRODUCTIVE ORGANS: No acute abnormality. BONES AND SOFT TISSUES: Gas is present within the subcutaneous soft tissues and in the right abdominal musculature. No acute osseous abnormality. IMPRESSION: 1. Status post cholecystectomy with mild stranding and minimal gas in the gallbladder fossa, typical following surgery. No formed fluid collection. 2. Gas in the subcutaneous soft tissues and right abdominal musculature, and minimal pneumoperitoneum, also within normal limits following surgery . 3. Small amount of free fluid in the anatomic pelvis, as expected . Electronically signed by: Audree Leas MD 01/16/2024 04:49 AM EDT RP Workstation: [id number]   DG Cholangiogram Operative Result Date: 01/15/2024 CLINICAL DATA:  Cholecystectomy. EXAM: INTRAOPERATIVE CHOLANGIOGRAM TECHNIQUE:  Cholangiographic images from the C-arm fluoroscopic device were submitted for interpretation post-operatively. Please see the procedural report for the amount of contrast and the fluoroscopy time utilized. FLUOROSCOPY: Radiation Exposure Index (as  provided by the fluoroscopic device): 22.1 mGy Kerma COMPARISON:  Limited right upper quadrant abdominal ultrasound dated 01/14/2024 FINDINGS: C-arm cine sequence of the right upper quadrant of the abdomen demonstrates contrast injection into the cystic duct remnant with some extraluminal spread of contrast as well as contrast filling the cystic duct remnant, common duct and intrahepatic ducts. There is free flow of contrast into the duodenum. No biliary ductal filling defects are seen. IMPRESSION: No retained ductal gallstones seen. Electronically Signed   By: Catherin Closs M.D.   On: 01/15/2024 12:54   US  Abdomen Limited RUQ (LIVER/GB) Result Date: 01/14/2024 CLINICAL DATA:  Epigastric/right upper quadrant pain since this morning. Increase in LFTs EXAM: ULTRASOUND ABDOMEN LIMITED RIGHT UPPER QUADRANT COMPARISON:  CT abdomen pelvis 07/22/2023 FINDINGS: Gallbladder: Many small gallstones. No wall thickening or pericholecystic fluid. Positive sonographic Murphy sign noted by sonographer. Common bile duct: Diameter: 6 mm. Liver: No focal lesion identified. Within normal limits in parenchymal echogenicity. Portal vein is patent on color Doppler imaging with normal direction of blood flow towards the liver. Other: None. IMPRESSION: Cholelithiasis with positive sonographic Murphy sign but no wall thickening or pericholecystic fluid. Findings suspicious for early acute cholecystitis. Consider HIDA scan for confirmation. Electronically Signed   By: Rozell Cornet M.D.   On: 01/14/2024 21:27   DG Chest 2 View Result Date: 01/14/2024 CLINICAL DATA:  chest pain EXAM: CHEST - 2 VIEW COMPARISON:  None available. FINDINGS: No focal airspace consolidation, pleural effusion, or  pneumothorax. No cardiomegaly. No acute fracture or destructive lesion. IMPRESSION: No acute cardiopulmonary abnormality. Electronically Signed   By: Rance Burrows M.D.   On: 01/14/2024 08:45     Procedures   Medications Ordered in the ED  0.9 %  sodium chloride  infusion ( Intravenous New Bag/Given 01/16/24 0504)  acetaminophen  (TYLENOL ) tablet 1,000 mg (has no administration in time range)  oxyCODONE  (Oxy IR/ROXICODONE ) immediate release tablet 5-10 mg (has no administration in time range)  methocarbamol  (ROBAXIN ) tablet 1,000 mg (has no administration in time range)  ketorolac  (TORADOL ) 15 MG/ML injection 30 mg (has no administration in time range)  iohexol  (OMNIPAQUE ) 350 MG/ML injection 100 mL (100 mLs Intravenous Contrast Given 01/16/24 0433)  fentaNYL (SUBLIMAZE) injection 100 mcg (100 mcg Intravenous Given 01/16/24 0459)  ondansetron  (ZOFRAN ) injection 4 mg (4 mg Intravenous Given 01/16/24 0501)                                    Medical Decision Making Patient who is POD 0 from gall bladder removal with intractable pain   Amount and/or Complexity of Data Reviewed Independent Historian: spouse    Details: See above  External Data Reviewed: labs and notes.    Details: Previous ED visit and operative notes reviewed  Labs: ordered.    Details: Pregnancy is negative urine is negative for UTI. Normal white 8.5, hemoglobin slight low 10.6, platelets slight elevation 403. Urine is negative normal sodium 136, normal potassium 3.6, normal creatinine 0.68  Radiology: ordered and independent interpretation performed.    Details: Post operative changes no SBO Discussion of management or test interpretation with external provider(s): 6:15 AM case d/w Dr. Aniceto Barley who has reviewed CT scan, robaxin , oxycodone  and Ibuprofen .  No indication for admission at this time    Risk Prescription drug management. Risk Details: Well appearing. Exam is benign and reassuring.  Additional medication added  to home narcotics.  Follow up with general  surgery.  Stable for discharge.  Strict returns     Final diagnoses:  Post-operative pain   No signs of systemic illness or infection. The patient is nontoxic-appearing on exam and vital signs are within normal limits.  I have reviewed the triage vital signs and the nursing notes. Pertinent labs & imaging results that were available during my care of the patient were reviewed by me and considered in my medical decision making (see chart for details). After history, exam, and medical workup I feel the patient has been appropriately medically screened and is safe for discharge home. Pertinent diagnoses were discussed with the patient. Patient was given return precautions.    ED Discharge Orders     ED Discharge Orders          Ordered    methocarbamol  (ROBAXIN ) 500 MG tablet  2 times daily        01/16/24 0615    dicyclomine (BENTYL) 20 MG tablet  2 times daily        01/16/24 0615               Alayiah Fontes, MD 01/16/24 1610

## 2024-01-16 NOTE — ED Notes (Signed)
 Patient returned from CT

## 2024-01-16 NOTE — ED Notes (Signed)
 Patient transported to CT

## 2024-01-16 NOTE — ED Triage Notes (Signed)
 The pt had her gallbladder removed yesterday at [facility]  when she was discharged she was in a lot of pain  she has been hurting since  she had oxycodone  and tylenol  1 1/2 hours ago  crying with the pain now lmp  one week ago

## 2024-01-18 LAB — SURGICAL PATHOLOGY

## 2024-01-19 ENCOUNTER — Other Ambulatory Visit: Payer: Self-pay

## 2024-01-19 ENCOUNTER — Encounter (HOSPITAL_BASED_OUTPATIENT_CLINIC_OR_DEPARTMENT_OTHER): Payer: Self-pay

## 2024-01-19 DIAGNOSIS — J45909 Unspecified asthma, uncomplicated: Secondary | ICD-10-CM | POA: Diagnosis not present

## 2024-01-19 DIAGNOSIS — G8918 Other acute postprocedural pain: Secondary | ICD-10-CM | POA: Diagnosis not present

## 2024-01-19 DIAGNOSIS — R7989 Other specified abnormal findings of blood chemistry: Secondary | ICD-10-CM | POA: Insufficient documentation

## 2024-01-19 DIAGNOSIS — R109 Unspecified abdominal pain: Secondary | ICD-10-CM | POA: Diagnosis present

## 2024-01-19 NOTE — ED Triage Notes (Signed)
 Pt arrives to ED for worsening abd and chest pain. Pt states the pain starts in the upper abd and radiates up to chest and shoulders. Pt recently had gallbladder removed last Friday. C/o nausea and denies vomiting and fevers. Tylenol  taken 2H pta. No relief.

## 2024-01-20 ENCOUNTER — Emergency Department (HOSPITAL_BASED_OUTPATIENT_CLINIC_OR_DEPARTMENT_OTHER)
Admission: EM | Admit: 2024-01-20 | Discharge: 2024-01-20 | Disposition: A | Discharge status: Home / Self Care | Attending: Emergency Medicine | Admitting: Emergency Medicine

## 2024-01-20 DIAGNOSIS — R7989 Other specified abnormal findings of blood chemistry: Secondary | ICD-10-CM

## 2024-01-20 DIAGNOSIS — G8918 Other acute postprocedural pain: Secondary | ICD-10-CM

## 2024-01-20 LAB — COMPREHENSIVE METABOLIC PANEL WITH GFR
ALT: 152 U/L — ABNORMAL HIGH (ref 0–44)
AST: 244 U/L — ABNORMAL HIGH (ref 15–41)
Albumin: 3.9 g/dL (ref 3.5–5.0)
Alkaline Phosphatase: 280 U/L — ABNORMAL HIGH (ref 38–126)
Anion gap: 11 (ref 5–15)
BUN: 9 mg/dL (ref 6–20)
CO2: 22 mmol/L (ref 22–32)
Calcium: 9.3 mg/dL (ref 8.9–10.3)
Chloride: 100 mmol/L (ref 98–111)
Creatinine, Ser: 0.83 mg/dL (ref 0.44–1.00)
GFR, Estimated: >60 mL/min (ref >60)
Glucose, Bld: 94 mg/dL (ref 70–99)
Potassium: 4.1 mmol/L (ref 3.5–5.1)
Sodium: 133 mmol/L — ABNORMAL LOW (ref 135–145)
Total Bilirubin: 0.6 mg/dL (ref 0.0–1.2)
Total Protein: 8.1 g/dL (ref 6.5–8.1)

## 2024-01-20 LAB — URINALYSIS, ROUTINE W REFLEX MICROSCOPIC
Bilirubin Urine: NEGATIVE
Glucose, UA: NEGATIVE mg/dL
Hgb urine dipstick: NEGATIVE
Ketones, ur: NEGATIVE mg/dL
Nitrite: NEGATIVE
Protein, ur: NEGATIVE mg/dL
Specific Gravity, Urine: 1.024 (ref 1.005–1.030)
pH: 6 (ref 5.0–8.0)

## 2024-01-20 LAB — CBC
HCT: 33.6 % — ABNORMAL LOW (ref 36.0–46.0)
Hemoglobin: 10.1 g/dL — ABNORMAL LOW (ref 12.0–15.0)
MCH: 21.4 pg — ABNORMAL LOW (ref 26.0–34.0)
MCHC: 30.1 g/dL (ref 30.0–36.0)
MCV: 71.2 fL — ABNORMAL LOW (ref 80.0–100.0)
Platelets: 394 10*3/uL (ref 150–400)
RBC: 4.72 MIL/uL (ref 3.87–5.11)
RDW: 16.6 % — ABNORMAL HIGH (ref 11.5–15.5)
WBC: 6 10*3/uL (ref 4.0–10.5)
nRBC: 0 % (ref 0.0–0.2)

## 2024-01-20 LAB — PREGNANCY, URINE: Preg Test, Ur: NEGATIVE

## 2024-01-20 LAB — LIPASE, BLOOD: Lipase: 17 U/L (ref 11–51)

## 2024-01-20 MED ORDER — SODIUM CHLORIDE 0.9 % IV BOLUS
1000.0000 mL | Freq: Once | INTRAVENOUS | Status: AC
  Administered 2024-01-20: 1000 mL via INTRAVENOUS

## 2024-01-20 MED ORDER — MORPHINE SULFATE (PF) 4 MG/ML IV SOLN
4.0000 mg | Freq: Once | INTRAVENOUS | Status: AC
  Administered 2024-01-20: 4 mg via INTRAVENOUS
  Filled 2024-01-20: qty 1

## 2024-01-20 MED ORDER — HALOPERIDOL LACTATE 5 MG/ML IJ SOLN
2.0000 mg | Freq: Once | INTRAMUSCULAR | Status: AC
  Administered 2024-01-20: 2 mg via INTRAVENOUS
  Filled 2024-01-20: qty 1

## 2024-01-20 MED ORDER — DIPHENHYDRAMINE HCL 50 MG/ML IJ SOLN
25.0000 mg | Freq: Once | INTRAMUSCULAR | Status: AC
  Administered 2024-01-20: 25 mg via INTRAVENOUS
  Filled 2024-01-20: qty 1

## 2024-01-20 NOTE — ED Provider Notes (Signed)
 [facility]ERGENC[facility] Emergency Department Provider Note MRN:  [id number]  Arrival date & time: 01/20/24     Chief Complaint   Abdominal pain History of Present Illness   Caitlin Anthony is a 23 y.o. year-old female with a history of Crohn's disease, cholecystitis presenting to the ED with chief complaint of abdominal pain.  Diffuse upper abdominal pain worsening tonight.  Recent cholecystectomy, has had some issues with postoperative pain.  Denies fever, nausea but no vomiting.  Not having very many bowel movements after surgery thus far.  Review of Systems  A thorough review of systems was obtained and all systems are negative except as noted in the HPI and PMH.   Patient's Health History    Past Medical History:  Diagnosis Date   Asthma    Crohn's disease (HCC)     Past Surgical History:  Procedure Laterality Date   CHOLECYSTECTOMY N/A 01/15/2024   Procedure: LAPAROSCOPIC CHOLECYSTECTOMY WITH INTRAOPERATIVE CHOLANGIOGRAM;  Surgeon: Kinsinger, Alphonso Aschoff, MD;  Location: [facility];  Service: General;  Laterality: N/A;   NO PAST SURGERIES      Family History  Problem Relation Age of Onset   Asthma Other    Diabetes Other     Social History   Socioeconomic History   Marital status: Single    Spouse name: Not on file   Number of children: Not on file   Years of education: Not on file   Highest education level: Not on file  Occupational History   Not on file  Tobacco Use   Smoking status: Never   Smokeless tobacco: Never  Vaping Use   Vaping status: Never Used  Substance and Sexual Activity   Alcohol use: Never   Drug use: Never   Sexual activity: Not on file  Other Topics Concern   Not on file  Social History Narrative   Not on file   Social Drivers of Health   Financial Resource Strain: Not on file  Food Insecurity: Low Risk  (01/18/2024)   Received from [facility]   Hunger Vital Sign    Within the past 12 months, you worried that your food  would run out before you got money to buy more: Never true    Within the past 12 months, the food you bought just didn't last and you didn't have money to get more. : Never true  Transportation Needs: No Transportation Needs (01/18/2024)   Received from [facility]    In the past 12 months, has lack of reliable transportation kept you from medical appointments, meetings, work or from getting things needed for daily living? : No  Physical Activity: Not on file  Stress: Not on file  Social Connections: Not on file  Intimate Partner Violence: Not on file     Physical Exam   Vitals:   01/19/24 2351 01/20/24 0230  BP: (!) 129/59 128/63  Pulse: 88 77  Resp: 18 18  Temp: 97.8 F (36.6 C) 98 F (36.7 C)  SpO2: 100% 100%    CONSTITUTIONAL: Well-appearing, NAD NEURO/PSYCH:  Alert and oriented x 3, no focal deficits EYES:  eyes equal and reactive ENT/NECK:  no LAD, no JVD CARDIO: Regular rate, well-perfused, normal S1 and S2 PULM:  CTAB no wheezing or rhonchi GI/GU:  non-distended, non-tender MSK/SPINE:  No gross deformities, no edema SKIN:  no rash, atraumatic   *Additional and/or pertinent findings included in MDM below  Diagnostic and Interventional Summary    EKG Interpretation  Date/Time:    Ventricular Rate:    PR Interval:    QRS Duration:    QT Interval:    QTC Calculation:   R Axis:      Text Interpretation:         Labs Reviewed  COMPREHENSIVE METABOLIC PANEL WITH GFR - Abnormal; Notable for the following components:      Result Value   Sodium 133 (*)    AST 244 (*)    ALT 152 (*)    Alkaline Phosphatase 280 (*)    All other components within normal limits  CBC - Abnormal; Notable for the following components:   Hemoglobin 10.1 (*)    HCT 33.6 (*)    MCV 71.2 (*)    MCH 21.4 (*)    RDW 16.6 (*)    All other components within normal limits  URINALYSIS, ROUTINE W REFLEX MICROSCOPIC - Abnormal; Notable for the following components:    Leukocytes,Ua SMALL (*)    Bacteria, UA RARE (*)    All other components within normal limits  LIPASE, BLOOD  PREGNANCY, URINE    No orders to display    Medications  haloperidol lactate (HALDOL) injection 2 mg (2 mg Intravenous Given 01/20/24 0218)  sodium chloride  0.9 % bolus 1,000 mL (1,000 mLs Intravenous New Bag/Given 01/20/24 0218)  morphine  (PF) 4 MG/ML injection 4 mg (4 mg Intravenous Given 01/20/24 0401)  diphenhydrAMINE (BENADRYL) injection 25 mg (25 mg Intravenous Given 01/20/24 0401)     Procedures  /  Critical Care Procedures  ED Course and Medical Decision Making  Initial Impression and Ddx Postoperative pain.  Second ER visit for postoperative pain.  Reassuring evaluation with postoperative CT abdomen and pelvis a few days ago that was overall reassuring.  Could be related to constipation or ileus given the history provided of lack of bowel movements.  Had a cholangiogram that did not show any retained stones.  Providing symptomatic management and will reassess.  Past medical/surgical history that increases complexity of ED encounter: Cholecystectomy  Interpretation of Diagnostics I personally reviewed the Laboratory Testing and my interpretation is as follows: No significant blood count or electrolyte disturbance.  Mild elevation in LFTs compared to prior    Patient Reassessment and Ultimate Disposition/Management     Patient is feeling much better after medications listed above.  Abdomen is soft, vitals are normal.  I discussed the case with Dr. Davonna Estes of general surgery, the slight elevation in LFTs is not of great concern, would not be enough to immediately trigger admission or further testing.  And so given patient's improved clinical status she is appropriate for discharge with outpatient follow-up.  Return precautions provided.  Patient management required discussion with the following services or consulting groups:  None  Complexity of Problems Addressed Acute  illness or injury that poses threat of life of bodily function  Additional Data Reviewed and Analyzed Further history obtained from: Further history from spouse/family member  Additional Factors Impacting ED Encounter Risk Use of parenteral controlled substances and Consideration of hospitalization  Merrick Abe. Harless Lien, MD [facility] Emergency Medicine [facility] [email or web address]  Final Clinical Impressions(s) / ED Diagnoses     ICD-10-CM   1. Postoperative pain  G89.18     2. LFT elevation  R79.89       ED Discharge Orders     None        Discharge Instructions Discussed with and Provided to Patient:     Discharge Instructions  You were evaluated in the Emergency Department and after careful evaluation, we did not find any emergent condition requiring admission or further testing in the hospital.  Your exam/testing today is overall reassuring.  Recommend close follow-up with your surgeon to discuss your continued symptoms.  Use your home pain medications as needed.  We discussed your slightly elevated liver function tests, have your regular doctors keep an eye on this or repeat them to make sure they are improving.  Please return to the Emergency Department if you experience any worsening of your condition.   Thank you for allowing us  to be a part of your care.       Edson Graces, MD 01/20/24 [phone]

## 2024-01-20 NOTE — Discharge Instructions (Addendum)
 You were evaluated in the Emergency Department and after careful evaluation, we did not find any emergent condition requiring admission or further testing in the hospital.  Your exam/testing today is overall reassuring.  Recommend close follow-up with your surgeon to discuss your continued symptoms.  Use your home pain medications as needed.  We discussed your slightly elevated liver function tests, have your regular doctors keep an eye on this or repeat them to make sure they are improving.  Please return to the Emergency Department if you experience any worsening of your condition.   Thank you for allowing us  to be a part of your care.

## 2024-01-21 ENCOUNTER — Inpatient Hospital Stay (HOSPITAL_COMMUNITY)
Admission: EM | Admit: 2024-01-21 | Discharge: 2024-01-24 | DRG: 445 | Disposition: A | Discharge status: Home / Self Care | Attending: General Surgery | Admitting: General Surgery

## 2024-01-21 ENCOUNTER — Observation Stay (HOSPITAL_COMMUNITY)

## 2024-01-21 ENCOUNTER — Other Ambulatory Visit: Payer: Self-pay

## 2024-01-21 ENCOUNTER — Encounter (HOSPITAL_COMMUNITY): Payer: Self-pay

## 2024-01-21 DIAGNOSIS — K5090 Crohn's disease, unspecified, without complications: Secondary | ICD-10-CM | POA: Diagnosis present

## 2024-01-21 DIAGNOSIS — R7989 Other specified abnormal findings of blood chemistry: Secondary | ICD-10-CM

## 2024-01-21 DIAGNOSIS — Z825 Family history of asthma and other chronic lower respiratory diseases: Secondary | ICD-10-CM

## 2024-01-21 DIAGNOSIS — Z885 Allergy status to narcotic agent status: Secondary | ICD-10-CM

## 2024-01-21 DIAGNOSIS — R161 Splenomegaly, not elsewhere classified: Secondary | ICD-10-CM | POA: Diagnosis present

## 2024-01-21 DIAGNOSIS — Z79899 Other long term (current) drug therapy: Secondary | ICD-10-CM

## 2024-01-21 DIAGNOSIS — Z9049 Acquired absence of other specified parts of digestive tract: Secondary | ICD-10-CM

## 2024-01-21 DIAGNOSIS — G8918 Other acute postprocedural pain: Principal | ICD-10-CM

## 2024-01-21 DIAGNOSIS — Z91018 Allergy to other foods: Secondary | ICD-10-CM

## 2024-01-21 DIAGNOSIS — Z888 Allergy status to other drugs, medicaments and biological substances status: Secondary | ICD-10-CM

## 2024-01-21 DIAGNOSIS — Z833 Family history of diabetes mellitus: Secondary | ICD-10-CM

## 2024-01-21 DIAGNOSIS — J45909 Unspecified asthma, uncomplicated: Secondary | ICD-10-CM | POA: Diagnosis present

## 2024-01-21 DIAGNOSIS — Z6841 Body Mass Index (BMI) 40.0 and over, adult: Secondary | ICD-10-CM

## 2024-01-21 DIAGNOSIS — K831 Obstruction of bile duct: Principal | ICD-10-CM | POA: Diagnosis present

## 2024-01-21 DIAGNOSIS — Z7951 Long term (current) use of inhaled steroids: Secondary | ICD-10-CM

## 2024-01-21 LAB — CBC WITH DIFFERENTIAL/PLATELET
Abs Immature Granulocytes: 0.01 10*3/uL (ref 0.00–0.07)
Basophils Absolute: 0 10*3/uL (ref 0.0–0.1)
Basophils Relative: 1 %
Eosinophils Absolute: 0.1 10*3/uL (ref 0.0–0.5)
Eosinophils Relative: 3 %
HCT: 38.8 % (ref 36.0–46.0)
Hemoglobin: 11.3 g/dL — ABNORMAL LOW (ref 12.0–15.0)
Immature Granulocytes: 0 %
Lymphocytes Relative: 31 %
Lymphs Abs: 1.3 10*3/uL (ref 0.7–4.0)
MCH: 21.7 pg — ABNORMAL LOW (ref 26.0–34.0)
MCHC: 29.1 g/dL — ABNORMAL LOW (ref 30.0–36.0)
MCV: 74.5 fL — ABNORMAL LOW (ref 80.0–100.0)
Monocytes Absolute: 0.4 10*3/uL (ref 0.1–1.0)
Monocytes Relative: 9 %
Neutro Abs: 2.3 10*3/uL (ref 1.7–7.7)
Neutrophils Relative %: 56 %
Platelets: 412 10*3/uL — ABNORMAL HIGH (ref 150–400)
RBC: 5.21 MIL/uL — ABNORMAL HIGH (ref 3.87–5.11)
RDW: 16.7 % — ABNORMAL HIGH (ref 11.5–15.5)
WBC: 4.1 10*3/uL (ref 4.0–10.5)
nRBC: 0 % (ref 0.0–0.2)

## 2024-01-21 LAB — LIPASE, BLOOD: Lipase: 24 U/L (ref 11–51)

## 2024-01-21 LAB — COMPREHENSIVE METABOLIC PANEL WITH GFR
ALT: 500 U/L — ABNORMAL HIGH (ref 0–44)
AST: 401 U/L — ABNORMAL HIGH (ref 15–41)
Albumin: 3.8 g/dL (ref 3.5–5.0)
Alkaline Phosphatase: 288 U/L — ABNORMAL HIGH (ref 38–126)
Anion gap: 10 (ref 5–15)
BUN: 7 mg/dL (ref 6–20)
CO2: 22 mmol/L (ref 22–32)
Calcium: 9.2 mg/dL (ref 8.9–10.3)
Chloride: 101 mmol/L (ref 98–111)
Creatinine, Ser: 0.79 mg/dL (ref 0.44–1.00)
GFR, Estimated: >60 mL/min (ref >60)
Glucose, Bld: 116 mg/dL — ABNORMAL HIGH (ref 70–99)
Potassium: 3.8 mmol/L (ref 3.5–5.1)
Sodium: 133 mmol/L — ABNORMAL LOW (ref 135–145)
Total Bilirubin: 3.1 mg/dL — ABNORMAL HIGH (ref 0.0–1.2)
Total Protein: 8.8 g/dL — ABNORMAL HIGH (ref 6.5–8.1)

## 2024-01-21 MED ORDER — ENOXAPARIN SODIUM 40 MG/0.4ML IJ SOSY
40.0000 mg | PREFILLED_SYRINGE | INTRAMUSCULAR | Status: DC
  Administered 2024-01-21 – 2024-01-23 (×3): 40 mg via SUBCUTANEOUS
  Filled 2024-01-21 (×3): qty 0.4

## 2024-01-21 MED ORDER — ACETAMINOPHEN 325 MG PO TABS
650.0000 mg | ORAL_TABLET | Freq: Four times a day (QID) | ORAL | Status: DC | PRN
Start: 2024-01-21 — End: 2024-01-21

## 2024-01-21 MED ORDER — SODIUM CHLORIDE 0.9 % IV SOLN: INTRAVENOUS | Status: AC

## 2024-01-21 MED ORDER — ACETAMINOPHEN 650 MG RE SUPP: 650.0000 mg | Freq: Four times a day (QID) | RECTAL | Status: DC | PRN

## 2024-01-21 MED ORDER — SIMETHICONE 80 MG PO CHEW
40.0000 mg | CHEWABLE_TABLET | Freq: Four times a day (QID) | ORAL | Status: DC | PRN
  Administered 2024-01-21 – 2024-01-22 (×2): 40 mg via ORAL
  Filled 2024-01-21 (×2): qty 1

## 2024-01-21 MED ORDER — ONDANSETRON HCL 4 MG/2ML IJ SOLN
4.0000 mg | Freq: Four times a day (QID) | INTRAMUSCULAR | Status: DC | PRN
  Administered 2024-01-22 – 2024-01-23 (×5): 4 mg via INTRAVENOUS
  Filled 2024-01-21 (×5): qty 2

## 2024-01-21 MED ORDER — PIPERACILLIN-TAZOBACTAM 3.375 G IVPB
3.3750 g | Freq: Three times a day (TID) | INTRAVENOUS | Status: DC
  Administered 2024-01-22 (×2): 3.375 g via INTRAVENOUS
  Filled 2024-01-21 (×2): qty 50

## 2024-01-21 MED ORDER — GABAPENTIN 100 MG PO CAPS
300.0000 mg | ORAL_CAPSULE | Freq: Three times a day (TID) | ORAL | Status: DC
  Administered 2024-01-21 – 2024-01-24 (×9): 300 mg via ORAL
  Filled 2024-01-21 (×9): qty 3

## 2024-01-21 MED ORDER — KETOROLAC TROMETHAMINE 15 MG/ML IJ SOLN
15.0000 mg | Freq: Four times a day (QID) | INTRAMUSCULAR | Status: DC | PRN
  Administered 2024-01-21 – 2024-01-23 (×3): 15 mg via INTRAVENOUS
  Filled 2024-01-21 (×3): qty 1

## 2024-01-21 MED ORDER — METHOCARBAMOL 1000 MG/10ML IJ SOLN: 500.0000 mg | Freq: Three times a day (TID) | INTRAMUSCULAR | Status: DC | PRN

## 2024-01-21 MED ORDER — DOCUSATE SODIUM 100 MG PO CAPS
100.0000 mg | ORAL_CAPSULE | Freq: Two times a day (BID) | ORAL | Status: DC
  Administered 2024-01-21 – 2024-01-24 (×6): 100 mg via ORAL
  Filled 2024-01-21 (×6): qty 1

## 2024-01-21 MED ORDER — POLYETHYLENE GLYCOL 3350 17 G PO PACK: 17.0000 g | PACK | Freq: Every day | ORAL | Status: DC | PRN

## 2024-01-21 MED ORDER — PIPERACILLIN-TAZOBACTAM 3.375 G IVPB 30 MIN
3.3750 g | Freq: Once | INTRAVENOUS | Status: AC
  Administered 2024-01-21: 3.375 g via INTRAVENOUS
  Filled 2024-01-21: qty 50

## 2024-01-21 MED ORDER — ONDANSETRON 4 MG PO TBDP: 4.0000 mg | ORAL_TABLET | Freq: Four times a day (QID) | ORAL | Status: DC | PRN

## 2024-01-21 MED ORDER — HYDROMORPHONE HCL 1 MG/ML IJ SOLN: 0.5000 mg | INTRAMUSCULAR | Status: DC | PRN

## 2024-01-21 MED ORDER — GADOBUTROL 1 MMOL/ML IV SOLN
10.0000 mL | Freq: Once | INTRAVENOUS | Status: AC | PRN
  Administered 2024-01-21: 10 mL via INTRAVENOUS

## 2024-01-21 MED ORDER — ONDANSETRON HCL 4 MG/2ML IJ SOLN
4.0000 mg | Freq: Once | INTRAMUSCULAR | Status: AC
  Administered 2024-01-21: 4 mg via INTRAVENOUS
  Filled 2024-01-21: qty 2

## 2024-01-21 MED ORDER — ALBUTEROL SULFATE HFA 108 (90 BASE) MCG/ACT IN AERS: 2.0000 | INHALATION_SPRAY | RESPIRATORY_TRACT | Status: DC | PRN

## 2024-01-21 MED ORDER — METOPROLOL TARTRATE 5 MG/5ML IV SOLN: 5.0000 mg | Freq: Four times a day (QID) | INTRAVENOUS | Status: DC | PRN

## 2024-01-21 MED ORDER — METHOCARBAMOL 500 MG PO TABS: 500.0000 mg | ORAL_TABLET | Freq: Three times a day (TID) | ORAL | Status: DC | PRN

## 2024-01-21 MED ORDER — METHOCARBAMOL 1000 MG/10ML IJ SOLN
1000.0000 mg | Freq: Three times a day (TID) | INTRAMUSCULAR | Status: DC
  Administered 2024-01-21 – 2024-01-24 (×8): 1000 mg via INTRAVENOUS
  Filled 2024-01-21 (×9): qty 10

## 2024-01-21 MED ORDER — HYDROMORPHONE HCL 1 MG/ML IJ SOLN
1.0000 mg | Freq: Once | INTRAMUSCULAR | Status: AC
  Administered 2024-01-21: 1 mg via INTRAVENOUS
  Filled 2024-01-21: qty 1

## 2024-01-21 MED ORDER — HYDROMORPHONE HCL 1 MG/ML IJ SOLN
0.5000 mg | INTRAMUSCULAR | Status: DC | PRN
  Administered 2024-01-21 – 2024-01-22 (×5): 1 mg via INTRAVENOUS
  Filled 2024-01-21 (×5): qty 1

## 2024-01-21 MED ORDER — FLUTICASONE FUROATE-VILANTEROL 200-25 MCG/ACT IN AEPB
1.0000 | INHALATION_SPRAY | Freq: Every day | RESPIRATORY_TRACT | Status: DC
  Administered 2024-01-22 – 2024-01-24 (×3): 1 via RESPIRATORY_TRACT
  Filled 2024-01-21: qty 28

## 2024-01-21 MED ORDER — OXYCODONE HCL 5 MG PO TABS
5.0000 mg | ORAL_TABLET | ORAL | Status: DC | PRN
  Administered 2024-01-22 – 2024-01-23 (×5): 10 mg via ORAL
  Filled 2024-01-21 (×5): qty 2

## 2024-01-21 NOTE — ED Triage Notes (Signed)
 Pt presents d/t abnormal labs (elevated LFTs) and continued post surgical pain.  Pain score 7/10.  Pt was seen at [facility] and PCP for same yesterday and LFTs significantly increased between visits.    Pt had gallbladder removed x1 week ago.

## 2024-01-21 NOTE — H&P (Signed)
 H&P Note     Subjective: Patient is a 23 year old female who recently underwent laparoscopic cholecystectomy with IOC by Dr. Dorrie Gaudier on 01/15/24 for acute cholecystitis and was discharged from PACU on POD0. Patient returned to the ED on 01/16/24 with abdominal pain, labs showed mild elevation in AST/ALT which is expected post-operatively but no other significant abnormalities. CT showed normal post-op findings. She ended up back in the ED 6/17 with persistent abdominal pain and labs showed slight increased in AST and Alk Phos, stable ALT, normal Tbili. Patient's pain improved and she was discharged home. She followed up with her PCP yesterday and outpatient labs showed worsening in LFT elevation with Tbili up to 2.1. Today Tbili is 3.1 and LFTs otherwise all going up. Lipase is normal. Patient reports pain is epigastric and radiating to her back. Pain is not really at incision sites. She feels like it is similar to the pain she had before getting her gallbladder out. She has had associated nausea but no vomiting. She is passing flatus and has had 2 BM since surgery.   Objective: Vital signs in last 24 hours: Temp:  [98.4 F (36.9 C)] 98.4 F (36.9 C) (06/19 1355) Pulse Rate:  [89] 89 (06/19 1355) Resp:  [16] 16 (06/19 1355) BP: (173)/(89) 173/89 (06/19 1355) SpO2:  [100 %] 100 % (06/19 1355) Weight:  [158.8 kg] 158.8 kg (06/19 1423)    Intake/Output from previous day: No intake/output data recorded. Intake/Output this shift: No intake/output data recorded.  PE: General: pleasant, WD, morbidly obese female who is tearful and clearly uncomfortable HEENT: head is normocephalic, atraumatic.  Sclera are anicteric. Pupils equal and round.  Ears and nose without any masses or lesions.  Mouth is pink and moist Heart: regular, rate, and rhythm. Palpable radial and pedal pulses bilaterally Lungs: No wheezes, rhonchi, or rales noted.  Respiratory effort nonlabored Abd: soft, TTP in epigastrium  and RUQ, no peritonitis, ND, incisions appear C/D/I MS: all 4 extremities are symmetrical with no cyanosis, clubbing, or edema. Skin: warm and dry with no masses, lesions, or rashes Neuro: Cranial nerves 2-12 grossly intact, sensation is normal throughout Psych: A&Ox3 with an appropriate affect.    Lab Results:  Recent Labs    01/19/24 2356 01/21/24 1427  WBC 6.0 4.1  HGB 10.1* 11.3*  HCT 33.6* 38.8  PLT 394 412*   BMET Recent Labs    01/19/24 2356 01/21/24 1427  NA 133* 133*  K 4.1 3.8  CL 100 101  CO2 22 22  GLUCOSE 94 116*  BUN 9 7  CREATININE 0.83 0.79  CALCIUM 9.3 9.2   PT/INR No results for input(s): LABPROT, INR in the last 72 hours. CMP     Component Value Date/Time   NA 133 (L) 01/21/2024 1427   K 3.8 01/21/2024 1427   CL 101 01/21/2024 1427   CO2 22 01/21/2024 1427   GLUCOSE 116 (H) 01/21/2024 1427   BUN 7 01/21/2024 1427   CREATININE 0.79 01/21/2024 1427   CALCIUM 9.2 01/21/2024 1427   PROT 8.8 (H) 01/21/2024 1427   ALBUMIN 3.8 01/21/2024 1427   AST 401 (H) 01/21/2024 1427   ALT 500 (H) 01/21/2024 1427   ALKPHOS 288 (H) 01/21/2024 1427   BILITOT 3.1 (H) 01/21/2024 1427   GFRNONAA >60 01/21/2024 1427   GFRAA >60 10/25/2019 2255   Lipase     Component Value Date/Time   LIPASE 24 01/21/2024 1427       Studies/Results: No results  found.  Anti-infectives: Anti-infectives (From admission, onward)    None        Assessment/Plan Epigastric abdominal pain with transaminitis and hyperbilirubinemia POD6 s/p laparoscopic cholecystectomy with IOC - Labs as mentioned in HPI - concerned for possible post-op bile leak vs retained stone or biliary obstruction, also need to rule out acute hepatitis - needs MRCP to evaluate  - admit to observation  - keep NPO for now with workup ongoing  - trend labs   FEN: NPO, IVF@ 100 cc/h VTE: LMWH ID: no current abx, afebrile and no leukocytosis   LOS: 0 days      Annetta Killian, [facility] 01/21/2024, 3:52 PM Please see Amion for pager number during day hours 7:00am-4:30pm

## 2024-01-21 NOTE — ED Notes (Signed)
 ED TO INPATIENT HANDOFF REPORT  Name/Age/Gender Caitlin Anthony 23 y.o. female  Code Status    Code Status Orders  (From admission, onward)           Start     Ordered   01/21/24 1548  Full code  Continuous       Question:  By:  Answer:  Consent: discussion documented in EHR   01/21/24 1550           Code Status History     This patient has a current code status but no historical code status.       Home/SNF/Other Home  Chief Complaint Hyperbilirubinemia [E80.6]  Level of Care/Admitting Diagnosis ED Disposition     ED Disposition  Admit   Condition  --   Comment  Hospital Area: [facility] [[id number]]  Level of Care: Med-Surg [16]  May [facility] patient in observation at [facility] or [facility] if equivalent level of care is available:: No  Covid Evaluation: Asymptomatic - no recent exposure (last 10 days) testing not required  Diagnosis: Hyperbilirubinemia [161096]  Admitting Physician: CCS, MD [[id number]]  Attending Physician: CCS, MD [[id number]]  Bed request comments: 3E          Medical History Past Medical History:  Diagnosis Date   Asthma    Crohn's disease (HCC)     Allergies Allergies  Allergen Reactions   Haloperidol Decanoate Anxiety   Hydrocodone Itching and Rash   Pineapple Swelling    Also hives    IV Location/Drains/Wounds Patient Lines/Drains/Airways Status     Active Line/Drains/Airways     Name Placement date Placement time Site Days   Peripheral IV 01/21/24 20 G Right Antecubital 01/21/24  1600  Antecubital  less than 1   Wound 01/15/24 0816 Surgical Laparoscopic Abdomen 1: Left;Lateral 2: Mid;Upper 3: Right;Lateral 4: Right;Lower 01/15/24  0816  Abdomen  6            Labs/Imaging Results for orders placed or performed during the hospital encounter of 01/21/24 (from the past 48 hours)  Comprehensive metabolic panel     Status: Abnormal   Collection Time: 01/21/24  2:27 PM  Result Value Ref Range    Sodium 133 (L) 135 - 145 mmol/L   Potassium 3.8 3.5 - 5.1 mmol/L   Chloride 101 98 - 111 mmol/L   CO2 22 22 - 32 mmol/L   Glucose, Bld 116 (H) 70 - 99 mg/dL    Comment: Glucose reference range applies only to samples taken after fasting for at least 8 hours.   BUN 7 6 - 20 mg/dL   Creatinine, Ser 0.45 0.44 - 1.00 mg/dL   Calcium 9.2 8.9 - 40.9 mg/dL   Total Protein 8.8 (H) 6.5 - 8.1 g/dL   Albumin 3.8 3.5 - 5.0 g/dL   AST 811 (H) 15 - 41 U/L   ALT 500 (H) 0 - 44 U/L   Alkaline Phosphatase 288 (H) 38 - 126 U/L   Total Bilirubin 3.1 (H) 0.0 - 1.2 mg/dL   GFR, Estimated >91 >47 mL/min    Comment: (NOTE) Calculated using the CKD-EPI Creatinine Equation (2021)    Anion gap 10 5 - 15    Comment: Performed at [facility], [address], [place], [place] [address]  CBC with Differential     Status: Abnormal   Collection Time: 01/21/24  2:27 PM  Result Value Ref Range   WBC 4.1 4.0 - 10.5 K/uL  RBC 5.21 (H) 3.87 - 5.11 MIL/uL   Hemoglobin 11.3 (L) 12.0 - 15.0 g/dL   HCT 19.1 47.8 - 29.5 %   MCV 74.5 (L) 80.0 - 100.0 fL   MCH 21.7 (L) 26.0 - 34.0 pg   MCHC 29.1 (L) 30.0 - 36.0 g/dL   RDW 62.1 (H) 30.8 - 65.7 %   Platelets 412 (H) 150 - 400 K/uL   nRBC 0.0 0.0 - 0.2 %   Neutrophils Relative % 56 %   Neutro Abs 2.3 1.7 - 7.7 K/uL   Lymphocytes Relative 31 %   Lymphs Abs 1.3 0.7 - 4.0 K/uL   Monocytes Relative 9 %   Monocytes Absolute 0.4 0.1 - 1.0 K/uL   Eosinophils Relative 3 %   Eosinophils Absolute 0.1 0.0 - 0.5 K/uL   Basophils Relative 1 %   Basophils Absolute 0.0 0.0 - 0.1 K/uL   Immature Granulocytes 0 %   Abs Immature Granulocytes 0.01 0.00 - 0.07 K/uL    Comment: Performed at [facility], [address] [place], [place] [address]  Lipase, blood     Status: None   Collection Time: 01/21/24  2:27 PM  Result Value Ref Range   Lipase 24 11 - 51 U/L    Comment: Performed at [facility], [address]  [place], [place] [address]   No results found.  Pending Labs Unresulted Labs (From admission, onward)     Start     Ordered   01/22/24 0500  Comprehensive metabolic panel  Tomorrow morning,   R        01/21/24 1550   01/22/24 0500  CBC  Tomorrow morning,   R        01/21/24 1550   01/21/24 1551  Hepatitis panel, acute  ONCE - URGENT,   URGENT        01/21/24 1550   01/21/24 1548  HIV Antibody (routine testing w rflx)  (HIV Antibody (Routine testing w reflex) panel)  Once,   R        01/21/24 1550            Vitals/Pain Today's Vitals   01/21/24 1355 01/21/24 1423  BP: (!) 173/89   Pulse: 89   Resp: 16   Temp: 98.4 F (36.9 C)   TempSrc: Oral   SpO2: 100%   Weight:  (!) 158.8 kg  Height:  5' 4 (1.626 m)  PainSc:  6     Isolation Precautions No active isolations  Medications Medications  enoxaparin (LOVENOX) injection 40 mg (has no administration in time range)  0.9 %  sodium chloride  infusion (has no administration in time range)  oxyCODONE  (Oxy IR/ROXICODONE ) immediate release tablet 5-10 mg (has no administration in time range)  docusate sodium (COLACE) capsule 100 mg (has no administration in time range)  polyethylene glycol (MIRALAX / GLYCOLAX) packet 17 g (has no administration in time range)  ondansetron  (ZOFRAN -ODT) disintegrating tablet 4 mg (has no administration in time range)    Or  ondansetron  (ZOFRAN ) injection 4 mg (has no administration in time range)  simethicone  (MYLICON) chewable tablet 40 mg (has no administration in time range)  metoprolol tartrate (LOPRESSOR) injection 5 mg (has no administration in time range)  albuterol  (VENTOLIN  HFA) 108 (90 Base) MCG/ACT inhaler 2 puff (has no administration in time range)  fluticasone  furoate-vilanterol (BREO ELLIPTA) 200-25 MCG/ACT 1 puff (1 puff Inhalation Not Given 01/21/24 1727)  HYDROmorphone  (DILAUDID ) injection 0.5-1 mg (has no administration  in time range)  gabapentin (NEURONTIN) capsule 300 mg (has  no administration in time range)  ketorolac  (TORADOL ) 15 MG/ML injection 15 mg (has no administration in time range)  methocarbamol  (ROBAXIN ) injection 1,000 mg (has no administration in time range)  piperacillin-tazobactam (ZOSYN) IVPB 3.375 g (has no administration in time range)  piperacillin-tazobactam (ZOSYN) IVPB 3.375 g (has no administration in time range)  HYDROmorphone  (DILAUDID ) injection 1 mg (1 mg Intravenous Given 01/21/24 1605)  ondansetron  (ZOFRAN ) injection 4 mg (4 mg Intravenous Given 01/21/24 1606)  gadobutrol (GADAVIST) 1 MMOL/ML injection 10 mL (10 mLs Intravenous Contrast Given 01/21/24 1740)    Mobility walks with device

## 2024-01-21 NOTE — ED Provider Notes (Signed)
 [place] EMERGENCY DEPARTMENT AT [facility] Provider Note   CSN: [id number] Arrival date & time: 01/21/24  1338     Patient presents with: Abnormal Lab   Caitlin Anthony is a 23 y.o. female.   23 y.o female with a PMH of cholecystectomy presents to the ED with a chief complaint of right upper quadrant pain x 1 week. Patient had her gallbladder removed by Dr. Zadie Herter, she reports after the procedure she began to have some pain along her right upper quadrant, and thought this was likely due to the surgery.  Reports the pain has continued and now has worsened.  States she has been taking hydrocodone at home for pain control, last bowel movement was yesterday, had some blood on the tissue as she reports some hard stool, she does tell me that she has been trying to use some stool softeners as well.  She is passing gas.  Her last oral intake was this morning 2 pieces of bread. She continues to have nausea. She reports going to PCP office and having labs drawn these have continue to trend upward. She denies any vomiting, diarrhea, chest pain or sob.   The history is provided by the patient.  Abnormal Lab      Prior to Admission medications   Medication Sig Start Date End Date Taking? Authorizing Provider  acetaminophen  (TYLENOL ) 325 MG tablet Take 2 tablets (650 mg total) by mouth every 6 (six) hours as needed for up to 30 doses. 01/31/22   Arvilla Birmingham, MD  albuterol  (VENTOLIN  HFA) 108 (90 Base) MCG/ACT inhaler Inhale 2 puffs into the lungs every 4 (four) hours as needed for wheezing or shortness of breath (cough). 09/01/23   Charmayne Cooper, MD  bacitracin  ointment Apply 1 Application topically 2 (two) times daily. 12/05/22   Horton, Vonzella Guernsey, MD  buPROPion (WELLBUTRIN XL) 300 MG 24 hr tablet Take 300 mg by mouth daily.    [provider]  busPIRone (BUSPAR) 10 MG tablet Take 10 mg by mouth 3 (three) times daily.    [provider]  dicyclomine (BENTYL) 20  MG tablet Take 1 tablet (20 mg total) by mouth 2 (two) times daily. 01/16/24   Palumbo, April, MD  ibuprofen  (ADVIL ) 800 MG tablet Take 1 tablet (800 mg total) by mouth every 8 (eight) hours as needed. 01/15/24   Kinsinger, Alphonso Aschoff, MD  methocarbamol  (ROBAXIN ) 500 MG tablet Take 1 tablet (500 mg total) by mouth every 8 (eight) hours as needed for muscle spasms. 09/06/23   Long, Joshua G, MD  methocarbamol  (ROBAXIN ) 500 MG tablet Take 1 tablet (500 mg total) by mouth 2 (two) times daily. 01/16/24   Palumbo, April, MD  ondansetron  (ZOFRAN -ODT) 8 MG disintegrating tablet 8mg  ODT q8 hours prn nausea 01/16/24   Palumbo, April, MD  oxyCODONE  (OXY IR/ROXICODONE ) 5 MG immediate release tablet Take 1 tablet (5 mg total) by mouth every 6 (six) hours as needed for severe pain (pain score 7-10). 01/15/24   Kinsinger, Alphonso Aschoff, MD    Allergies: Haloperidol decanoate, Hydrocodone, and Pineapple    Review of Systems  Constitutional:  Positive for chills and fever.  HENT:  Negative for sore throat.   Respiratory:  Negative for shortness of breath.   Cardiovascular:  Negative for chest pain.  Gastrointestinal:  Positive for abdominal pain and nausea. Negative for blood in stool, constipation and vomiting.  Genitourinary:  Negative for flank pain.  Musculoskeletal:  Negative for back pain.  All  other systems reviewed and are negative.   Updated Vital Signs BP (!) 173/89 (BP Location: Left Arm)   Pulse 89   Temp 98.4 F (36.9 C) (Oral)   Resp 16   Ht 5' 4 (1.626 m)   Wt (!) 158.8 kg   LMP 01/11/2024 Comment: negative urine preg as of 01/14/24  SpO2 100%   BMI 60.08 kg/m   Physical Exam Vitals and nursing note reviewed.  Constitutional:      Appearance: Normal appearance. She is obese.  HENT:     Head: Normocephalic and atraumatic.     Mouth/Throat:     Mouth: Mucous membranes are moist.   Cardiovascular:     Rate and Rhythm: Normal rate.  Pulmonary:     Effort: Pulmonary effort is normal.   Abdominal:     General: Abdomen is flat.     Palpations: Abdomen is soft.     Tenderness: There is abdominal tenderness. There is guarding.   Musculoskeletal:     Cervical back: Normal range of motion and neck supple.   Skin:    General: Skin is warm and dry.   Neurological:     Mental Status: She is alert and oriented to person, place, and time.     (all labs ordered are listed, but only abnormal results are displayed) Labs Reviewed  COMPREHENSIVE METABOLIC PANEL WITH GFR - Abnormal; Notable for the following components:      Result Value   Sodium 133 (*)    Glucose, Bld 116 (*)    Total Protein 8.8 (*)    AST 401 (*)    ALT 500 (*)    Alkaline Phosphatase 288 (*)    Total Bilirubin 3.1 (*)    All other components within normal limits  CBC WITH DIFFERENTIAL/PLATELET - Abnormal; Notable for the following components:   RBC 5.21 (*)    Hemoglobin 11.3 (*)    MCV 74.5 (*)    MCH 21.7 (*)    MCHC 29.1 (*)    RDW 16.7 (*)    Platelets 412 (*)    All other components within normal limits  LIPASE, BLOOD  HIV ANTIBODY (ROUTINE TESTING W REFLEX)  CBC  CREATININE, SERUM  HEPATITIS PANEL, ACUTE    EKG: None  Radiology: No results found.   Medications Ordered in the ED  enoxaparin (LOVENOX) injection 40 mg (has no administration in time range)  0.9 %  sodium chloride  infusion (has no administration in time range)  acetaminophen  (TYLENOL ) tablet 650 mg (has no administration in time range)    Or  acetaminophen  (TYLENOL ) suppository 650 mg (has no administration in time range)  oxyCODONE  (Oxy IR/ROXICODONE ) immediate release tablet 5-10 mg (has no administration in time range)  HYDROmorphone  (DILAUDID ) injection 0.5-1 mg (has no administration in time range)  methocarbamol  (ROBAXIN ) tablet 500 mg (has no administration in time range)    Or  methocarbamol  (ROBAXIN ) injection 500 mg (has no administration in time range)  docusate sodium (COLACE) capsule 100 mg (has no  administration in time range)  polyethylene glycol (MIRALAX / GLYCOLAX) packet 17 g (has no administration in time range)  ondansetron  (ZOFRAN -ODT) disintegrating tablet 4 mg (has no administration in time range)    Or  ondansetron  (ZOFRAN ) injection 4 mg (has no administration in time range)  simethicone  (MYLICON) chewable tablet 40 mg (has no administration in time range)  metoprolol tartrate (LOPRESSOR) injection 5 mg (has no administration in time range)  HYDROmorphone  (DILAUDID ) injection 1 mg (1  mg Intravenous Given 01/21/24 1605)  ondansetron  (ZOFRAN ) injection 4 mg (4 mg Intravenous Given 01/21/24 1606)                                 Medical Decision Making Amount and/or Complexity of Data Reviewed Labs: ordered.     This patient presents to the ED for concern of abdominal pain, this involves a number of treatment options, and is a complaint that carries with it a high risk of complications and morbidity.  The differential diagnosis includes post op infection, post cholecystectomy syndrome versus spilled stones.    Co morbidities: Discussed in HPI   Brief History:  See HPI.   EMR reviewed including pt PMHx, past surgical history and past visits to ER.   See HPI for more details   Lab Tests:  I ordered and independently interpreted labs.  The pertinent results include:    CBC with no leukocytosis, hemoglobin is within her baseline.  CMP remarkable for an AST that is now 400 today compared to two days ago at 244. Also ALT is 500 today compared to 152. Total bili of 3.1    Imaging Studies:  MRCP Pending.   Medicines ordered:  I ordered medication including zofran , dilaudid   for pain control  Reevaluation of the patient after these medicines showed that the patient improved I have reviewed the patients home medicines and have made adjustments as needed Consults:  I requested consultation with general surgery Loetta Ringer T. PA,  and discussed lab and imaging findings  as well as pertinent plan - they recommend: admission will need MRCP, will place temporary admission orders.   Reevaluation:  After the interventions noted above I re-evaluated patient and found that they have :stayed the same  Social Determinants of Health:  The patient's social determinants of health were a factor in the care of this patient  Problem List / ED Course:  Presented to the ED with a chief complaint of right upper quadrant pain that began a week after postop.  Reports her pain has increasingly gotten worse over the last couple days.  Is on hydrocodone at home for pain control, has also been taking some stool softeners with a successful bowel movement yesterday with some blood on the toilet paper.  She is currently passing gas, also endorsing some nausea.  Last oral intake was this morning with 2 pieces of bread.  Reports a subjective fever with a Tmax of 100. LFTs are significantly elevated today to AST of 401, ALT of 500, total bilirubin of 3.1.  General surgery has been consulted, Alesia Husky. PA has seen patient in the emergency department, placed temporary admission orders.  Patient will need a MRCP.  She received Dilaudid , Zofran  to help with pain control without much improvement in symptoms. She remains hemodynamically stable here, afebrile.  No leukocytosis, no fever here.  Patient is hemodynamically stable for admission at this time.  Dispostion:  Patient will be admitted by general surgery for further evaluation of her ongoing pain along with ongoing elevation of her LFTs.  Temporary admission orders have been placed.  MRCP is currently pending.   Portions of this note were generated with Scientist, clinical (histocompatibility and immunogenetics). Dictation errors may occur despite best attempts at proofreading.  Final diagnoses:  Post-op pain  Elevated LFTs    ED Discharge Orders     None          [place], Natalyn Szymanowski, PA-C  01/21/24 1607    Tegeler, Marine Sia, MD 01/21/24 1627

## 2024-01-22 ENCOUNTER — Observation Stay (HOSPITAL_COMMUNITY)

## 2024-01-22 DIAGNOSIS — Z9049 Acquired absence of other specified parts of digestive tract: Secondary | ICD-10-CM | POA: Diagnosis not present

## 2024-01-22 DIAGNOSIS — Z79899 Other long term (current) drug therapy: Secondary | ICD-10-CM | POA: Diagnosis not present

## 2024-01-22 DIAGNOSIS — Z833 Family history of diabetes mellitus: Secondary | ICD-10-CM | POA: Diagnosis not present

## 2024-01-22 DIAGNOSIS — Z885 Allergy status to narcotic agent status: Secondary | ICD-10-CM | POA: Diagnosis not present

## 2024-01-22 DIAGNOSIS — Z7951 Long term (current) use of inhaled steroids: Secondary | ICD-10-CM | POA: Diagnosis not present

## 2024-01-22 DIAGNOSIS — Z91018 Allergy to other foods: Secondary | ICD-10-CM | POA: Diagnosis not present

## 2024-01-22 DIAGNOSIS — J45909 Unspecified asthma, uncomplicated: Secondary | ICD-10-CM | POA: Diagnosis present

## 2024-01-22 DIAGNOSIS — Z6841 Body Mass Index (BMI) 40.0 and over, adult: Secondary | ICD-10-CM | POA: Diagnosis not present

## 2024-01-22 DIAGNOSIS — R161 Splenomegaly, not elsewhere classified: Secondary | ICD-10-CM | POA: Diagnosis present

## 2024-01-22 DIAGNOSIS — K831 Obstruction of bile duct: Secondary | ICD-10-CM | POA: Diagnosis present

## 2024-01-22 DIAGNOSIS — K5090 Crohn's disease, unspecified, without complications: Secondary | ICD-10-CM | POA: Diagnosis present

## 2024-01-22 DIAGNOSIS — Z825 Family history of asthma and other chronic lower respiratory diseases: Secondary | ICD-10-CM | POA: Diagnosis not present

## 2024-01-22 DIAGNOSIS — Z888 Allergy status to other drugs, medicaments and biological substances status: Secondary | ICD-10-CM | POA: Diagnosis not present

## 2024-01-22 LAB — HEPATITIS PANEL, ACUTE
HCV Ab: NONREACTIVE
Hep A IgM: NONREACTIVE
Hep B C IgM: NONREACTIVE
Hepatitis B Surface Ag: NONREACTIVE

## 2024-01-22 LAB — CBC
HCT: 39.1 % (ref 36.0–46.0)
Hemoglobin: 11.2 g/dL — ABNORMAL LOW (ref 12.0–15.0)
MCH: 21.6 pg — ABNORMAL LOW (ref 26.0–34.0)
MCHC: 28.6 g/dL — ABNORMAL LOW (ref 30.0–36.0)
MCV: 75.5 fL — ABNORMAL LOW (ref 80.0–100.0)
Platelets: 384 10*3/uL (ref 150–400)
RBC: 5.18 MIL/uL — ABNORMAL HIGH (ref 3.87–5.11)
RDW: 17.1 % — ABNORMAL HIGH (ref 11.5–15.5)
WBC: 3.9 10*3/uL — ABNORMAL LOW (ref 4.0–10.5)
nRBC: 0 % (ref 0.0–0.2)

## 2024-01-22 LAB — IRON AND TIBC
Iron: 47 ug/dL (ref 28–170)
Saturation Ratios: 11 % (ref 10.4–31.8)
TIBC: 413 ug/dL (ref 250–450)
UIBC: 366 ug/dL

## 2024-01-22 LAB — COMPREHENSIVE METABOLIC PANEL WITH GFR
ALT: 435 U/L — ABNORMAL HIGH (ref 0–44)
AST: 299 U/L — ABNORMAL HIGH (ref 15–41)
Albumin: 3.7 g/dL (ref 3.5–5.0)
Alkaline Phosphatase: 271 U/L — ABNORMAL HIGH (ref 38–126)
Anion gap: 13 (ref 5–15)
BUN: 7 mg/dL (ref 6–20)
CO2: 21 mmol/L — ABNORMAL LOW (ref 22–32)
Calcium: 9.2 mg/dL (ref 8.9–10.3)
Chloride: 102 mmol/L (ref 98–111)
Creatinine, Ser: 0.8 mg/dL (ref 0.44–1.00)
GFR, Estimated: >60 mL/min (ref >60)
Glucose, Bld: 102 mg/dL — ABNORMAL HIGH (ref 70–99)
Potassium: 3.9 mmol/L (ref 3.5–5.1)
Sodium: 136 mmol/L (ref 135–145)
Total Bilirubin: 3.5 mg/dL — ABNORMAL HIGH (ref 0.0–1.2)
Total Protein: 8.3 g/dL — ABNORMAL HIGH (ref 6.5–8.1)

## 2024-01-22 LAB — FERRITIN: Ferritin: 35 ng/mL (ref 11–307)

## 2024-01-22 LAB — HIV ANTIBODY (ROUTINE TESTING W REFLEX): HIV Screen 4th Generation wRfx: NONREACTIVE

## 2024-01-22 MED ORDER — PANTOPRAZOLE SODIUM 40 MG IV SOLR
40.0000 mg | INTRAVENOUS | Status: DC
  Administered 2024-01-22 – 2024-01-24 (×3): 40 mg via INTRAVENOUS
  Filled 2024-01-22 (×3): qty 10

## 2024-01-22 NOTE — Consult Note (Signed)
 [facility] Consultation Note  Referring Provider: [facility] Primary Care Physician:  Cranston Dk, MD Primary Gastroenterologist:  Para Bold  Reason for Consultation:  abdominal pain  HPI: Caitlin Anthony is a 23 y.o. female cholecystectomy (cholelithiasis, chronic cholecystitis on pathology) 6/13.  Has had ongoing pain epigastric with radiation to back.  LFTs essentially normal pre-operatively, normal 2 months ago, and significantly elevated now.  IOC 6/13 normal.  MRCP yesterday normal.  Pain persists.  Same pain as that which necessitated her cholecystectomy.  No blood in stool.   Past Medical History:  Diagnosis Date   Asthma    Crohn's disease [facility])     Past Surgical History:  Procedure Laterality Date   CHOLECYSTECTOMY N/A 01/15/2024   Procedure: LAPAROSCOPIC CHOLECYSTECTOMY WITH INTRAOPERATIVE CHOLANGIOGRAM;  Surgeon: Kinsinger, Alphonso Aschoff, MD;  Location: [facility] ORS;  Service: General;  Laterality: N/A;   NO PAST SURGERIES      Prior to Admission medications   Medication Sig Start Date End Date Taking? Authorizing Provider  acetaminophen  (TYLENOL ) 325 MG tablet Take 2 tablets (650 mg total) by mouth every 6 (six) hours as needed for up to 30 doses. 01/31/22  Yes Trifan, Janalyn Me, MD  albuterol  (VENTOLIN  HFA) 108 (90 Base) MCG/ACT inhaler Inhale 2 puffs into the lungs every 4 (four) hours as needed for wheezing or shortness of breath (cough). 09/01/23  Yes Charmayne Cooper, MD  budesonide (ENTOCORT EC) 3 MG 24 hr capsule Take 9 mg by mouth daily. 08/04/23  Yes [provider]  budesonide-formoterol (SYMBICORT) 160-4.5 MCG/ACT inhaler Inhale 2 puffs into the lungs in the morning and at bedtime. 09/15/23 09/14/24 Yes [provider]  busPIRone (BUSPAR) 10 MG tablet Take 10 mg by mouth 3 (three) times daily.   Yes [provider]  dicyclomine (BENTYL) 20 MG tablet Take 1 tablet (20 mg total) by mouth 2 (two) times daily. 01/16/24  Yes Palumbo,  April, MD  ibuprofen  (ADVIL ) 800 MG tablet Take 1 tablet (800 mg total) by mouth every 8 (eight) hours as needed. 01/15/24  Yes Kinsinger, Alphonso Aschoff, MD  methocarbamol  (ROBAXIN ) 500 MG tablet Take 1 tablet (500 mg total) by mouth 2 (two) times daily. 01/16/24  Yes Palumbo, April, MD  ondansetron  (ZOFRAN -ODT) 8 MG disintegrating tablet 8mg  ODT q8 hours prn nausea 01/16/24  Yes Palumbo, April, MD  oxyCODONE  (OXY IR/ROXICODONE ) 5 MG immediate release tablet Take 1 tablet (5 mg total) by mouth every 6 (six) hours as needed for severe pain (pain score 7-10). 01/15/24  Yes Kinsinger, Alphonso Aschoff, MD  buPROPion (WELLBUTRIN XL) 300 MG 24 hr tablet Take 300 mg by mouth daily. Patient not taking: Reported on 01/21/2024    [provider]  LOMAIRA 8 MG TABS Take 1 tablet by mouth. Patient not taking: Reported on 01/21/2024 10/25/23   [provider]  metoCLOPramide (REGLAN) 10 MG tablet Take 10 mg by mouth in the morning, at noon, in the evening, and at bedtime. Patient not taking: Reported on 01/21/2024 01/18/24 01/28/24  [provider]    Current Facility-Administered Medications  Medication Dose Route Frequency Provider Last Rate Last Admin   0.9 %  sodium chloride  infusion   Intravenous Continuous Johnson, Kelly R, PA-C 100 mL/hr at 01/21/24 1820 New Bag at 01/21/24 1820   albuterol  (VENTOLIN  HFA) 108 (90 Base) MCG/ACT inhaler 2 puff  2 puff Inhalation Q4H PRN Aldon Hung A, MD       docusate sodium (COLACE) capsule 100 mg  100 mg  Oral BID Annetta Killian, PA-C   100 mg at 01/22/24 4098   enoxaparin (LOVENOX) injection 40 mg  40 mg Subcutaneous Q24H Annetta Killian, PA-C   40 mg at 01/21/24 2144   fluticasone  furoate-vilanterol (BREO ELLIPTA) 200-25 MCG/ACT 1 puff  1 puff Inhalation Daily Aldon Hung A, MD   1 puff at 01/22/24 1191   gabapentin (NEURONTIN) capsule 300 mg  300 mg Oral TID Adalberto Acton, MD   300 mg at 01/22/24 4782   HYDROmorphone  (DILAUDID ) injection  0.5-1 mg  0.5-1 mg Intravenous Q2H PRN Adalberto Acton, MD   1 mg at 01/22/24 0932   ketorolac  (TORADOL ) 15 MG/ML injection 15 mg  15 mg Intravenous Q6H PRN Adalberto Acton, MD   15 mg at 01/22/24 0510   methocarbamol  (ROBAXIN ) injection 1,000 mg  1,000 mg Intravenous Q8H Aldon Hung A, MD   1,000 mg at 01/22/24 0510   metoprolol tartrate (LOPRESSOR) injection 5 mg  5 mg Intravenous Q6H PRN Annetta Killian, PA-C       ondansetron  (ZOFRAN -ODT) disintegrating tablet 4 mg  4 mg Oral Q6H PRN Annetta Killian, PA-C       Or   ondansetron  (ZOFRAN ) injection 4 mg  4 mg Intravenous Q6H PRN Annetta Killian, PA-C   4 mg at 01/22/24 0004   oxyCODONE  (Oxy IR/ROXICODONE ) immediate release tablet 5-10 mg  5-10 mg Oral Q4H PRN Annetta Killian, PA-C   10 mg at 01/22/24 0805   piperacillin-tazobactam (ZOSYN) IVPB 3.375 g  3.375 g Intravenous Q8H Waylan Haggard, RPH 12.5 mL/hr at 01/22/24 0808 3.375 g at 01/22/24 9562   polyethylene glycol (MIRALAX / GLYCOLAX) packet 17 g  17 g Oral Daily PRN Annetta Killian, PA-C       simethicone  (MYLICON) chewable tablet 40 mg  40 mg Oral Q6H PRN [place] Breath R, PA-C   40 mg at 01/22/24 0510    Allergies as of 01/21/2024 - Review Complete 01/21/2024  Allergen Reaction Noted   Haloperidol decanoate Anxiety 01/20/2024   Hydrocodone Itching and Rash 11/11/2022   Pineapple Swelling 04/28/2019    Family History  Problem Relation Age of Onset   Asthma Other    Diabetes Other     Social History   Socioeconomic History   Marital status: Single    Spouse name: Not on file   Number of children: Not on file   Years of education: Not on file   Highest education level: Not on file  Occupational History   Not on file  Tobacco Use   Smoking status: Never   Smokeless tobacco: Never  Vaping Use   Vaping status: Never Used  Substance and Sexual Activity   Alcohol use: Never   Drug use: Never   Sexual activity: Not on file  Other Topics Concern   Not on  file  Social History Narrative   Not on file   Social Drivers of Health   Financial Resource Strain: Not on file  Food Insecurity: No Food Insecurity (01/21/2024)   Hunger Vital Sign    Worried About Running Out of Food in the Last Year: Never true    Ran Out of Food in the Last Year: Never true  Transportation Needs: No Transportation Needs (01/21/2024)   PRAPARE - Administrator, Civil Service (Medical): No    Lack of Transportation (Non-Medical): No  Physical Activity: Not on file  Stress: Not on file  Social Connections: Not  on file  Intimate Partner Violence: Not At Risk (01/21/2024)   Humiliation, Afraid, Rape, and Kick questionnaire    Fear of Current or Ex-Partner: No    Emotionally Abused: No    Physically Abused: No    Sexually Abused: No    Review of Systems: As per HPI, all others negative  Physical Exam: Vital signs in last 24 hours: Temp:  [98.2 F (36.8 C)-98.4 F (36.9 C)] 98.2 F (36.8 C) (06/20 0542) Pulse Rate:  [71-89] 71 (06/20 0542) Resp:  [16-17] 17 (06/20 0542) BP: (109-173)/(42-89) 121/75 (06/20 0542) SpO2:  [95 %-100 %] 95 % (06/20 0740) Weight:  [158.8 kg] 158.8 kg (06/19 1423) Last BM Date : 01/19/24 General:   Alert,  Well-developed, obese, NAD Head:  Normocephalic and atraumatic. Eyes:  Sclera clear, no icterus.   Conjunctiva pink. Ears:  Normal auditory acuity. Nose:  No deformity, discharge,  or lesions. Mouth:  No deformity or lesions.  Oropharynx pink & moist. Neck:  Thick but supple; no masses or thyromegaly. Lungs:  No visible respiratory distress Abdomen:  Soft, protuberant, mild upper abdominal tenderness without peritonitis Msk:  Symmetrical without gross deformities. Normal posture. Pulses:  Normal pulses noted. Extremities:  Without clubbing or edema. Neurologic:  Alert and  oriented x4;  grossly normal neurologically. Skin:  Intact without significant lesions or rashes. Psych:  Alert and cooperative. Normal mood  and affect.   Lab Results: Recent Labs    01/19/24 2356 01/21/24 1427 01/22/24 0443  WBC 6.0 4.1 3.9*  HGB 10.1* 11.3* 11.2*  HCT 33.6* 38.8 39.1  PLT 394 412* 384   BMET Recent Labs    01/19/24 2356 01/21/24 1427 01/22/24 0443  NA 133* 133* 136  K 4.1 3.8 3.9  CL 100 101 102  CO2 22 22 21*  GLUCOSE 94 116* 102*  BUN 9 7 7   CREATININE 0.83 0.79 0.80  CALCIUM 9.3 9.2 9.2   LFT Recent Labs    01/22/24 0443  PROT 8.3*  ALBUMIN 3.7  AST 299*  ALT 435*  ALKPHOS 271*  BILITOT 3.5*   PT/INR No results for input(s): LABPROT, INR in the last 72 hours.  Studies/Results: MR ABDOMEN MRCP W WO CONTAST Result Date: 01/21/2024 CLINICAL DATA:  Elevated LFTs status post cholecystectomy EXAM: MRI ABDOMEN WITHOUT AND WITH CONTRAST (INCLUDING MRCP) TECHNIQUE: Multiplanar multisequence MR imaging of the abdomen was performed both before and after the administration of intravenous contrast. Heavily T2-weighted images of the biliary and pancreatic ducts were obtained, and three-dimensional MRCP images were rendered by post processing. CONTRAST:  10mL GADAVIST GADOBUTROL 1 MMOL/ML IV SOLN COMPARISON:  CT abdomen pelvis, 01/16/2024 FINDINGS: Lower chest: No acute abnormality. Hepatobiliary: No focal liver abnormality is seen. Status post cholecystectomy. Small volume of residual fluid in the gallbladder fossa and vicinity, similar to prior examination (series 4, image 20). No rim enhancement. Mild dilatation of the common bile duct, measuring up to 0.9 cm in caliber, however tapering smoothly to the ampulla without calculus or other obstruction (series 10, image 22). Pancreas: Unremarkable. No pancreatic ductal dilatation or surrounding inflammatory changes. Spleen: Splenomegaly, maximum span 14.4 cm. Adrenals/Urinary Tract: Adrenal glands are unremarkable. Kidneys are normal, without renal calculi, solid lesion, or hydronephrosis. Stomach/Bowel: Stomach is within normal limits. No evidence  of bowel wall thickening, distention, or inflammatory changes. Vascular/Lymphatic: No significant vascular findings are present. No enlarged abdominal lymph nodes. Other: No abdominal wall hernia. Evidence of recent laparoscopic port access (series 4, image 21). No ascites. Musculoskeletal:  No acute or significant osseous findings. IMPRESSION: 1. Status post cholecystectomy. Small volume of residual simple signal fluid in the gallbladder fossa and vicinity, similar to prior examination. No rim enhancement. Presence or absence of infection is not established by imaging. 2. Mild dilatation of the common bile duct, measuring up to 0.9 cm in caliber, however tapering smoothly to the ampulla without calculus or other obstruction. 3. Splenomegaly. Electronically Signed   By: Fredricka Jenny M.D.   On: 01/21/2024 19:44   MR 3D Recon At Scanner Result Date: 01/21/2024 CLINICAL DATA:  Elevated LFTs status post cholecystectomy EXAM: MRI ABDOMEN WITHOUT AND WITH CONTRAST (INCLUDING MRCP) TECHNIQUE: Multiplanar multisequence MR imaging of the abdomen was performed both before and after the administration of intravenous contrast. Heavily T2-weighted images of the biliary and pancreatic ducts were obtained, and three-dimensional MRCP images were rendered by post processing. CONTRAST:  10mL GADAVIST GADOBUTROL 1 MMOL/ML IV SOLN COMPARISON:  CT abdomen pelvis, 01/16/2024 FINDINGS: Lower chest: No acute abnormality. Hepatobiliary: No focal liver abnormality is seen. Status post cholecystectomy. Small volume of residual fluid in the gallbladder fossa and vicinity, similar to prior examination (series 4, image 20). No rim enhancement. Mild dilatation of the common bile duct, measuring up to 0.9 cm in caliber, however tapering smoothly to the ampulla without calculus or other obstruction (series 10, image 22). Pancreas: Unremarkable. No pancreatic ductal dilatation or surrounding inflammatory changes. Spleen: Splenomegaly, maximum  span 14.4 cm. Adrenals/Urinary Tract: Adrenal glands are unremarkable. Kidneys are normal, without renal calculi, solid lesion, or hydronephrosis. Stomach/Bowel: Stomach is within normal limits. No evidence of bowel wall thickening, distention, or inflammatory changes. Vascular/Lymphatic: No significant vascular findings are present. No enlarged abdominal lymph nodes. Other: No abdominal wall hernia. Evidence of recent laparoscopic port access (series 4, image 21). No ascites. Musculoskeletal: No acute or significant osseous findings. IMPRESSION: 1. Status post cholecystectomy. Small volume of residual simple signal fluid in the gallbladder fossa and vicinity, similar to prior examination. No rim enhancement. Presence or absence of infection is not established by imaging. 2. Mild dilatation of the common bile duct, measuring up to 0.9 cm in caliber, however tapering smoothly to the ampulla without calculus or other obstruction. 3. Splenomegaly. Electronically Signed   By: Fredricka Jenny M.D.   On: 01/21/2024 19:44    Impression:   Abdominal pain s/p cholecystectomy. Gallstones and chronic cholecystitis, cholecystectomy 6/13 with IOC negative at that time. Elevated LFTs.  Retained CBD stone or ampullary spasm versus medication (anesthesia versus antibiotics vs other).  Her LFTs were essentially normal preoperatively and were normal 2 months ago.   Crohn's disease per chart, but I'm not convinced. Overall constellation of symptoms consistent with retained bile duct stone but IOC and MRCP were unrevealing.    Plan:   Check abnormal LFT serologies. Would stop zosyn if at all possible (common cause for elevated LFTs especially post-operatively) and substitute for something else if antibiotics still needed. Follow LFTs. Diet per surgical team. If LFTs don't improve over next couple days and pain symptoms don't significantly improve, would need to consider endoscopy for evaluation of ampulla and endoscopic  ultrasound to exclude subtle CBD stone (which would unusual with both MRCP and IOC negative). [facility] will follow.   LOS: 0 days   Renee Beale M  01/22/2024, 9:51 AM  Cell [phone] If no answer or after 5 PM call [phone]

## 2024-01-22 NOTE — Progress Notes (Signed)
 H&P Note     Subjective: Patient reports ongoing pain and nausea this AM. Passing flatus but no BM. Tbili continues to rise.   Objective: Vital signs in last 24 hours: Temp:  [98.2 F (36.8 C)-98.4 F (36.9 C)] 98.2 F (36.8 C) (06/20 0542) Pulse Rate:  [71-89] 71 (06/20 0542) Resp:  [16-17] 17 (06/20 0542) BP: (109-173)/(42-89) 121/75 (06/20 0542) SpO2:  [95 %-100 %] 95 % (06/20 0740) Weight:  [158.8 kg] 158.8 kg (06/19 1423) Last BM Date : 01/19/24  Intake/Output from previous day: 06/19 0701 - 06/20 0700 In: 574.5 [P.O.:120; I.V.:404.5; IV Piggyback:50] Out: -  Intake/Output this shift: No intake/output data recorded.  PE: General: pleasant, WD, morbidly obese female who is clearly uncomfortable HEENT: Sclera are anicteric.  Heart: regular, rate, and rhythm.  Lungs: Respiratory effort nonlabored Abd: soft, TTP in epigastrium and RUQ, no peritonitis, ND, incisions appear C/D/I    Lab Results:  Recent Labs    01/21/24 1427 01/22/24 0443  WBC 4.1 3.9*  HGB 11.3* 11.2*  HCT 38.8 39.1  PLT 412* 384   BMET Recent Labs    01/21/24 1427 01/22/24 0443  NA 133* 136  K 3.8 3.9  CL 101 102  CO2 22 21*  GLUCOSE 116* 102*  BUN 7 7  CREATININE 0.79 0.80  CALCIUM 9.2 9.2   PT/INR No results for input(s): LABPROT, INR in the last 72 hours. CMP     Component Value Date/Time   NA 136 01/22/2024 0443   K 3.9 01/22/2024 0443   CL 102 01/22/2024 0443   CO2 21 (L) 01/22/2024 0443   GLUCOSE 102 (H) 01/22/2024 0443   BUN 7 01/22/2024 0443   CREATININE 0.80 01/22/2024 0443   CALCIUM 9.2 01/22/2024 0443   PROT 8.3 (H) 01/22/2024 0443   ALBUMIN 3.7 01/22/2024 0443   AST 299 (H) 01/22/2024 0443   ALT 435 (H) 01/22/2024 0443   ALKPHOS 271 (H) 01/22/2024 0443   BILITOT 3.5 (H) 01/22/2024 0443   GFRNONAA >60 01/22/2024 0443   GFRAA >60 10/25/2019 2255   Lipase     Component Value Date/Time   LIPASE 24 01/21/2024 1427       Studies/Results: MR  ABDOMEN MRCP W WO CONTAST Result Date: 01/21/2024 CLINICAL DATA:  Elevated LFTs status post cholecystectomy EXAM: MRI ABDOMEN WITHOUT AND WITH CONTRAST (INCLUDING MRCP) TECHNIQUE: Multiplanar multisequence MR imaging of the abdomen was performed both before and after the administration of intravenous contrast. Heavily T2-weighted images of the biliary and pancreatic ducts were obtained, and three-dimensional MRCP images were rendered by post processing. CONTRAST:  10mL GADAVIST GADOBUTROL 1 MMOL/ML IV SOLN COMPARISON:  CT abdomen pelvis, 01/16/2024 FINDINGS: Lower chest: No acute abnormality. Hepatobiliary: No focal liver abnormality is seen. Status post cholecystectomy. Small volume of residual fluid in the gallbladder fossa and vicinity, similar to prior examination (series 4, image 20). No rim enhancement. Mild dilatation of the common bile duct, measuring up to 0.9 cm in caliber, however tapering smoothly to the ampulla without calculus or other obstruction (series 10, image 22). Pancreas: Unremarkable. No pancreatic ductal dilatation or surrounding inflammatory changes. Spleen: Splenomegaly, maximum span 14.4 cm. Adrenals/Urinary Tract: Adrenal glands are unremarkable. Kidneys are normal, without renal calculi, solid lesion, or hydronephrosis. Stomach/Bowel: Stomach is within normal limits. No evidence of bowel wall thickening, distention, or inflammatory changes. Vascular/Lymphatic: No significant vascular findings are present. No enlarged abdominal lymph nodes. Other: No abdominal wall hernia. Evidence of recent laparoscopic port access (series 4, image  21). No ascites. Musculoskeletal: No acute or significant osseous findings. IMPRESSION: 1. Status post cholecystectomy. Small volume of residual simple signal fluid in the gallbladder fossa and vicinity, similar to prior examination. No rim enhancement. Presence or absence of infection is not established by imaging. 2. Mild dilatation of the common bile duct,  measuring up to 0.9 cm in caliber, however tapering smoothly to the ampulla without calculus or other obstruction. 3. Splenomegaly. Electronically Signed   By: Fredricka Jenny M.D.   On: 01/21/2024 19:44   MR 3D Recon At Scanner Result Date: 01/21/2024 CLINICAL DATA:  Elevated LFTs status post cholecystectomy EXAM: MRI ABDOMEN WITHOUT AND WITH CONTRAST (INCLUDING MRCP) TECHNIQUE: Multiplanar multisequence MR imaging of the abdomen was performed both before and after the administration of intravenous contrast. Heavily T2-weighted images of the biliary and pancreatic ducts were obtained, and three-dimensional MRCP images were rendered by post processing. CONTRAST:  10mL GADAVIST GADOBUTROL 1 MMOL/ML IV SOLN COMPARISON:  CT abdomen pelvis, 01/16/2024 FINDINGS: Lower chest: No acute abnormality. Hepatobiliary: No focal liver abnormality is seen. Status post cholecystectomy. Small volume of residual fluid in the gallbladder fossa and vicinity, similar to prior examination (series 4, image 20). No rim enhancement. Mild dilatation of the common bile duct, measuring up to 0.9 cm in caliber, however tapering smoothly to the ampulla without calculus or other obstruction (series 10, image 22). Pancreas: Unremarkable. No pancreatic ductal dilatation or surrounding inflammatory changes. Spleen: Splenomegaly, maximum span 14.4 cm. Adrenals/Urinary Tract: Adrenal glands are unremarkable. Kidneys are normal, without renal calculi, solid lesion, or hydronephrosis. Stomach/Bowel: Stomach is within normal limits. No evidence of bowel wall thickening, distention, or inflammatory changes. Vascular/Lymphatic: No significant vascular findings are present. No enlarged abdominal lymph nodes. Other: No abdominal wall hernia. Evidence of recent laparoscopic port access (series 4, image 21). No ascites. Musculoskeletal: No acute or significant osseous findings. IMPRESSION: 1. Status post cholecystectomy. Small volume of residual simple signal  fluid in the gallbladder fossa and vicinity, similar to prior examination. No rim enhancement. Presence or absence of infection is not established by imaging. 2. Mild dilatation of the common bile duct, measuring up to 0.9 cm in caliber, however tapering smoothly to the ampulla without calculus or other obstruction. 3. Splenomegaly. Electronically Signed   By: Fredricka Jenny M.D.   On: 01/21/2024 19:44    Anti-infectives: Anti-infectives (From admission, onward)    Start     Dose/Rate Route Frequency Ordered Stop   01/21/24 2300  piperacillin-tazobactam (ZOSYN) IVPB 3.375 g        3.375 g 12.5 mL/hr over 240 Minutes Intravenous Every 8 hours 01/21/24 1647     01/21/24 1700  piperacillin-tazobactam (ZOSYN) IVPB 3.375 g        3.375 g 100 mL/hr over 30 Minutes Intravenous  Once 01/21/24 1647 01/21/24 1850        Assessment/Plan Epigastric abdominal pain with transaminitis and hyperbilirubinemia POD7 s/p laparoscopic cholecystectomy with IOC - Tbili up to 3.5, AST/ALT slightly down - MRCP with small volume residual simple fluid in gallbladder fossa which appears similar to CT 5 days ago, mild dilation of CBD but tapers smoothly to ampulla without calculus or other obstruction, splenomegaly  - GI consulted this AM for input, await recs  - keep NPO for now with workup ongoing  - trend labs   FEN: NPO, IVF@ 100 cc/h VTE: LMWH ID: Zosyn 6/19>>  LOS: 0 days      Caitlin Anthony, [facility] 01/22/2024, 9:00 AM  Please see Amion for pager number during day hours 7:00am-4:30pm

## 2024-01-22 NOTE — Progress Notes (Signed)
   01/22/24 0924  TOC Brief Assessment  Insurance and Status Reviewed  Patient has primary care physician Yes  Home environment has been reviewed home w/ roommates  Prior level of function: independent  Prior/Current Home Services No current home services  Social Drivers of Health Review SDOH reviewed no interventions necessary  Readmission risk has been reviewed Yes  Transition of care needs no transition of care needs at this time

## 2024-01-22 NOTE — Plan of Care (Signed)

## 2024-01-23 LAB — CBC
HCT: 37 % (ref 36.0–46.0)
Hemoglobin: 10.5 g/dL — ABNORMAL LOW (ref 12.0–15.0)
MCH: 21.5 pg — ABNORMAL LOW (ref 26.0–34.0)
MCHC: 28.4 g/dL — ABNORMAL LOW (ref 30.0–36.0)
MCV: 75.8 fL — ABNORMAL LOW (ref 80.0–100.0)
Platelets: 356 10*3/uL (ref 150–400)
RBC: 4.88 MIL/uL (ref 3.87–5.11)
RDW: 17.1 % — ABNORMAL HIGH (ref 11.5–15.5)
WBC: 3.8 10*3/uL — ABNORMAL LOW (ref 4.0–10.5)
nRBC: 0 % (ref 0.0–0.2)

## 2024-01-23 LAB — HEPATIC FUNCTION PANEL
ALT: 309 U/L — ABNORMAL HIGH (ref 0–44)
AST: 152 U/L — ABNORMAL HIGH (ref 15–41)
Albumin: 3.4 g/dL — ABNORMAL LOW (ref 3.5–5.0)
Alkaline Phosphatase: 238 U/L — ABNORMAL HIGH (ref 38–126)
Bilirubin, Direct: 0.4 mg/dL — ABNORMAL HIGH (ref 0.0–0.2)
Indirect Bilirubin: 1 mg/dL — ABNORMAL HIGH (ref 0.3–0.9)
Total Bilirubin: 1.4 mg/dL — ABNORMAL HIGH (ref 0.0–1.2)
Total Protein: 7.8 g/dL (ref 6.5–8.1)

## 2024-01-23 LAB — BASIC METABOLIC PANEL WITH GFR
Anion gap: 12 (ref 5–15)
BUN: 5 mg/dL — ABNORMAL LOW (ref 6–20)
CO2: 22 mmol/L (ref 22–32)
Calcium: 9 mg/dL (ref 8.9–10.3)
Chloride: 101 mmol/L (ref 98–111)
Creatinine, Ser: 0.85 mg/dL (ref 0.44–1.00)
GFR, Estimated: >60 mL/min (ref >60)
Glucose, Bld: 93 mg/dL (ref 70–99)
Potassium: 3.6 mmol/L (ref 3.5–5.1)
Sodium: 135 mmol/L (ref 135–145)

## 2024-01-23 LAB — LIPASE, BLOOD: Lipase: 33 U/L (ref 11–51)

## 2024-01-23 NOTE — Progress Notes (Signed)
 Caitlin Anthony 9:23 AM  Subjective: Discussed case with patient hospital computer chart reviewed including IntraOp cholangiogram and MRCP and case discussed with my partner Dr. Burnette and surgical note appreciated patient says her pain is a little better no new complaints Objective: Vital signs stable afebrile patient not examined today currently in the bathroom LFTs decreased white count okay lipase normal  Assessment: Postop pain with elevated liver test currently improving  Plan: Consider ERCP or EUS if patient continues to have pain or liver tests do not resolve with outpatient follow-up and will check on tomorrow and please call me sooner if any GI question or problem in the meantime  [facility]  office [phone] After 5PM or if no answer call [phone]

## 2024-01-23 NOTE — Progress Notes (Signed)
 H&P Note     Subjective: Patient reports less pain and nausea this AM. Passing flatus but no BM. Tbili down this am  Objective: Vital signs in last 24 hours: Temp:  [97.4 F (36.3 C)-98.9 F (37.2 C)] 98.2 F (36.8 C) (06/21 0627) Pulse Rate:  [77-90] 77 (06/21 0627) Resp:  [18] 18 (06/21 0627) BP: (124-141)/(59-69) 124/69 (06/21 0627) SpO2:  [96 %-100 %] 98 % (06/21 0627) Last BM Date : 01/19/24  Intake/Output from previous day: 06/20 0701 - 06/21 0700 In: 360 [P.O.:360] Out: -  Intake/Output this shift: No intake/output data recorded.  PE: General: pleasant, WD, morbidly obese female who is clearly uncomfortable HEENT: Sclera are anicteric.  Abd: soft, TTP in epigastrium and RUQ, no peritonitis, ND, incisions appear C/D/I    Lab Results:  Recent Labs    01/22/24 0443 01/23/24 0540  WBC 3.9* 3.8*  HGB 11.2* 10.5*  HCT 39.1 37.0  PLT 384 356   BMET Recent Labs    01/22/24 0443 01/23/24 0540  NA 136 135  K 3.9 3.6  CL 102 101  CO2 21* 22  GLUCOSE 102* 93  BUN 7 5*  CREATININE 0.80 0.85  CALCIUM 9.2 9.0   PT/INR No results for input(s): LABPROT, INR in the last 72 hours. CMP     Component Value Date/Time   NA 135 01/23/2024 0540   K 3.6 01/23/2024 0540   CL 101 01/23/2024 0540   CO2 22 01/23/2024 0540   GLUCOSE 93 01/23/2024 0540   BUN 5 (L) 01/23/2024 0540   CREATININE 0.85 01/23/2024 0540   CALCIUM 9.0 01/23/2024 0540   PROT 7.8 01/23/2024 0540   ALBUMIN 3.4 (L) 01/23/2024 0540   AST 152 (H) 01/23/2024 0540   ALT 309 (H) 01/23/2024 0540   ALKPHOS 238 (H) 01/23/2024 0540   BILITOT 1.4 (H) 01/23/2024 0540   GFRNONAA >60 01/23/2024 0540   GFRAA >60 10/25/2019 2255   Lipase     Component Value Date/Time   LIPASE 33 01/23/2024 0540       Studies/Results: US  LIVER DOPPLER Result Date: 01/22/2024 CLINICAL DATA:  Abnormal liver function tests EXAM: DUPLEX ULTRASOUND OF LIVER TECHNIQUE: Color and duplex Doppler ultrasound was  performed to evaluate the hepatic in-flow and out-flow vessels. COMPARISON:  MRI abdomen 01/21/2024 FINDINGS: Liver: No focal lesion. Diffusely increased parenchymal echogenicity. Normal hepatic contour without nodularity. No focal lesion, mass or intrahepatic biliary ductal dilatation. Main Portal Vein size: 0.8 cm Portal Vein Velocities Main Prox:  33 cm/sec Main Mid: 38 cm/sec Main Dist:  22 cm/sec Right: 14 cm/sec Left: 16 cm/sec Hepatic Vein Velocities Right:  23 cm/sec Middle:  19 cm/sec Left:  51 cm/sec IVC: Present and patent with normal respiratory phasicity. Hepatic Artery Velocity:  96 cm/sec Splenic Vein Velocity:  18 cm/sec Spleen: 10.3 cm x 10.3 cm x 12.6 cm with a total volume of 292 cm^3 (411 cm^3 is upper limit normal) Portal Vein Occlusion/Thrombus: No Splenic Vein Occlusion/Thrombus: No Ascites: None Varices: None IMPRESSION: 1. Diffuse increased echogenicity of the hepatic parenchyma is a nonspecific indicator of hepatocellular dysfunction, most commonly steatosis. 2. Patent portal vein with appropriate direction of flow. Electronically Signed   By: Aliene Lloyd M.D.   On: 01/22/2024 13:48   MR ABDOMEN MRCP W WO CONTAST Result Date: 01/21/2024 CLINICAL DATA:  Elevated LFTs status post cholecystectomy EXAM: MRI ABDOMEN WITHOUT AND WITH CONTRAST (INCLUDING MRCP) TECHNIQUE: Multiplanar multisequence MR imaging of the abdomen was performed both before and after  the administration of intravenous contrast. Heavily T2-weighted images of the biliary and pancreatic ducts were obtained, and three-dimensional MRCP images were rendered by post processing. CONTRAST:  10mL GADAVIST  GADOBUTROL  1 MMOL/ML IV SOLN COMPARISON:  CT abdomen pelvis, 01/16/2024 FINDINGS: Lower chest: No acute abnormality. Hepatobiliary: No focal liver abnormality is seen. Status post cholecystectomy. Small volume of residual fluid in the gallbladder fossa and vicinity, similar to prior examination (series 4, image 20). No rim  enhancement. Mild dilatation of the common bile duct, measuring up to 0.9 cm in caliber, however tapering smoothly to the ampulla without calculus or other obstruction (series 10, image 22). Pancreas: Unremarkable. No pancreatic ductal dilatation or surrounding inflammatory changes. Spleen: Splenomegaly, maximum span 14.4 cm. Adrenals/Urinary Tract: Adrenal glands are unremarkable. Kidneys are normal, without renal calculi, solid lesion, or hydronephrosis. Stomach/Bowel: Stomach is within normal limits. No evidence of bowel wall thickening, distention, or inflammatory changes. Vascular/Lymphatic: No significant vascular findings are present. No enlarged abdominal lymph nodes. Other: No abdominal wall hernia. Evidence of recent laparoscopic port access (series 4, image 21). No ascites. Musculoskeletal: No acute or significant osseous findings. IMPRESSION: 1. Status post cholecystectomy. Small volume of residual simple signal fluid in the gallbladder fossa and vicinity, similar to prior examination. No rim enhancement. Presence or absence of infection is not established by imaging. 2. Mild dilatation of the common bile duct, measuring up to 0.9 cm in caliber, however tapering smoothly to the ampulla without calculus or other obstruction. 3. Splenomegaly. Electronically Signed   By: Marolyn JONETTA Jaksch M.D.   On: 01/21/2024 19:44   MR 3D Recon At Scanner Result Date: 01/21/2024 CLINICAL DATA:  Elevated LFTs status post cholecystectomy EXAM: MRI ABDOMEN WITHOUT AND WITH CONTRAST (INCLUDING MRCP) TECHNIQUE: Multiplanar multisequence MR imaging of the abdomen was performed both before and after the administration of intravenous contrast. Heavily T2-weighted images of the biliary and pancreatic ducts were obtained, and three-dimensional MRCP images were rendered by post processing. CONTRAST:  10mL GADAVIST  GADOBUTROL  1 MMOL/ML IV SOLN COMPARISON:  CT abdomen pelvis, 01/16/2024 FINDINGS: Lower chest: No acute abnormality.  Hepatobiliary: No focal liver abnormality is seen. Status post cholecystectomy. Small volume of residual fluid in the gallbladder fossa and vicinity, similar to prior examination (series 4, image 20). No rim enhancement. Mild dilatation of the common bile duct, measuring up to 0.9 cm in caliber, however tapering smoothly to the ampulla without calculus or other obstruction (series 10, image 22). Pancreas: Unremarkable. No pancreatic ductal dilatation or surrounding inflammatory changes. Spleen: Splenomegaly, maximum span 14.4 cm. Adrenals/Urinary Tract: Adrenal glands are unremarkable. Kidneys are normal, without renal calculi, solid lesion, or hydronephrosis. Stomach/Bowel: Stomach is within normal limits. No evidence of bowel wall thickening, distention, or inflammatory changes. Vascular/Lymphatic: No significant vascular findings are present. No enlarged abdominal lymph nodes. Other: No abdominal wall hernia. Evidence of recent laparoscopic port access (series 4, image 21). No ascites. Musculoskeletal: No acute or significant osseous findings. IMPRESSION: 1. Status post cholecystectomy. Small volume of residual simple signal fluid in the gallbladder fossa and vicinity, similar to prior examination. No rim enhancement. Presence or absence of infection is not established by imaging. 2. Mild dilatation of the common bile duct, measuring up to 0.9 cm in caliber, however tapering smoothly to the ampulla without calculus or other obstruction. 3. Splenomegaly. Electronically Signed   By: Marolyn JONETTA Jaksch M.D.   On: 01/21/2024 19:44    Anti-infectives: Anti-infectives (From admission, onward)    Start     Dose/Rate Route Frequency Ordered Stop  01/21/24 2300  piperacillin -tazobactam (ZOSYN ) IVPB 3.375 g  Status:  Discontinued        3.375 g 12.5 mL/hr over 240 Minutes Intravenous Every 8 hours 01/21/24 1647 01/22/24 1009   01/21/24 1700  piperacillin -tazobactam (ZOSYN ) IVPB 3.375 g        3.375 g 100 mL/hr over  30 Minutes Intravenous  Once 01/21/24 1647 01/21/24 1850        Assessment/Plan Epigastric abdominal pain with transaminitis and hyperbilirubinemia POD8 s/p laparoscopic cholecystectomy with IOC - Tbili up to 3.5, LFT's all trending down now.  Tbili 1.4 - MRCP with small volume residual simple fluid in gallbladder fossa which appears similar to CT 5 days ago, mild dilation of CBD but tapers smoothly to ampulla without calculus or other obstruction, splenomegaly  - GI consulted and is following  - cont clears for now - trend labs.  If labs continue to improve tomorrow, will try advancing diet  FEN: clears, IVF@ 100 cc/h VTE: LMWH ID: Zosyn  6/19>>6/20  LOS: 1 day      [name], MD  [facility] 01/23/2024, 7:42 AM Please see Amion for pager number during day hours 7:00am-4:30pm

## 2024-01-24 LAB — COMPREHENSIVE METABOLIC PANEL WITH GFR
ALT: 222 U/L — ABNORMAL HIGH (ref 0–44)
AST: 89 U/L — ABNORMAL HIGH (ref 15–41)
Albumin: 3.6 g/dL (ref 3.5–5.0)
Alkaline Phosphatase: 211 U/L — ABNORMAL HIGH (ref 38–126)
Anion gap: 11 (ref 5–15)
BUN: 6 mg/dL (ref 6–20)
CO2: 20 mmol/L — ABNORMAL LOW (ref 22–32)
Calcium: 9.1 mg/dL (ref 8.9–10.3)
Chloride: 106 mmol/L (ref 98–111)
Creatinine, Ser: 0.77 mg/dL (ref 0.44–1.00)
GFR, Estimated: >60 mL/min (ref >60)
Glucose, Bld: 93 mg/dL (ref 70–99)
Potassium: 3.7 mmol/L (ref 3.5–5.1)
Sodium: 137 mmol/L (ref 135–145)
Total Bilirubin: 0.9 mg/dL (ref 0.0–1.2)
Total Protein: 8.2 g/dL — ABNORMAL HIGH (ref 6.5–8.1)

## 2024-01-24 LAB — ANTI-SMOOTH MUSCLE ANTIBODY, IGG: F-Actin IgG: 24 U — ABNORMAL HIGH (ref 0–19)

## 2024-01-24 LAB — MITOCHONDRIAL ANTIBODIES: Mitochondrial M2 Ab, IgG: <20 U (ref 0.0–20.0)

## 2024-01-24 MED ORDER — TOLNAFTATE 1 % EX POWD
Freq: Two times a day (BID) | CUTANEOUS | Status: DC
  Filled 2024-01-24: qty 45

## 2024-01-24 NOTE — Discharge Summary (Signed)
 Patient ID: Caitlin Anthony [id number] 23 y.o. 2000/09/15  01/21/2024  Discharge date and time: 01/24/2024 12:56 PM  Admitting Physician: Dr Signe  Discharge Physician: [name]  Admission Diagnoses: Hyperbilirubinemia [E80.6] Post-op pain [G89.18] Elevated LFTs [R79.89]  Discharge Diagnoses: biliary obstruction   Discharged Condition: good    Hospital Course: Patient admitted after cholecystectomy several days prior with increased abdominal pain and hyperbilirubinemia.  MRCP and liver US  were negative.  Symptoms resolved with bowel rest and time.  Patient was discharged tolerating a diet and not requiring further pain management  Consults: GI  Significant Diagnostic Studies: labs: cmet, cbc and radiology: MRI: MRCP and Ultrasound: Liver doppler  Treatments: IV hydration and analgesia: acetaminophen   Disposition: Home

## 2024-01-24 NOTE — Discharge Instructions (Signed)
LAPAROSCOPIC SURGERY: POST OP INSTRUCTIONS  DIET: Follow a light bland diet the first 24 hours after arrival home, such as soup, liquids, crackers, etc.  Be sure to include lots of fluids daily.  Avoid fast food or heavy meals as your are more likely to get nauseated.  Eat a low fat the next few days after surgery.   Take your usually prescribed home medications unless otherwise directed. PAIN CONTROL: Pain is best controlled by a usual combination of three different methods TOGETHER: Ice/Heat Over the counter pain medication Prescription pain medication Most patients will experience some swelling and bruising around the incisions.  Ice packs or heating pads (30-60 minutes up to 6 times a day) will help. Use ice for the first few days to help decrease swelling and bruising, then switch to heat to help relax tight/sore spots and speed recovery.  Some people prefer to use ice alone, heat alone, alternating between ice & heat.  Experiment to what works for you.  Swelling and bruising can take several weeks to resolve.   It is helpful to take an over-the-counter pain medication regularly for the first few weeks.  Choose one of the following that works best for you: Naproxen (Aleve, etc)  Two 220mg tabs twice a day Ibuprofen (Advil, etc) Three 200mg tabs four times a day (every meal & bedtime) A  prescription for pain medication (such as percocet, vicodin, oxycodone, hydrocodone, etc) should be given to you upon discharge.  Take your pain medication as prescribed.  If you are having problems/concerns with the prescription medicine (does not control pain, nausea, vomiting, rash, itching, etc), please call us [phone] to see if we need to switch you to a different pain medicine that will work better for you and/or control your side effect better. If you need a refill on your pain medication, please contact your pharmacy.  They will contact our office to request authorization. Prescriptions will not be  filled after 5 pm or on week-ends.   Avoid getting constipated.  Between the surgery and the pain medications, it is common to experience some constipation.  Increasing fluid intake and taking a fiber supplement (such as Metamucil, Citrucel, FiberCon, MiraLax, etc) 1-2 times a day regularly will usually help prevent this problem from occurring.  A mild laxative (prune juice, Milk of Magnesia, MiraLax, etc) should be taken according to package directions if there are no bowel movements after 48 hours.   Watch out for diarrhea.  If you have many loose bowel movements, simplify your diet to bland foods & liquids for a few days.  Stop any stool softeners and decrease your fiber supplement.  Switching to mild anti-diarrheal medications (Kayopectate, Pepto Bismol) can help.  If this worsens or does not improve, please call us. Wash / shower every day.  You may shower over the dressings as they are waterproof.  Continue to shower over incision(s) after the dressing is off. Remove your waterproof bandages 5 days after surgery.  You may leave the incision open to air.  You may replace a dressing/Band-Aid to cover the incision for comfort if you wish.  ACTIVITIES as tolerated:   You may resume regular (light) daily activities beginning the next day--such as daily self-care, walking, climbing stairs--gradually increasing activities as tolerated.  If you can walk 30 minutes without difficulty, it is safe to try more intense activity such as jogging, treadmill, bicycling, low-impact aerobics, swimming, etc. Save the most intensive and strenuous activity for last such as sit-ups, heavy   lifting, contact sports, etc  Refrain from any heavy lifting or straining until you are off narcotics for pain control.   DO NOT PUSH THROUGH PAIN.  Let pain be your guide: If it hurts to do something, don't do it.  Pain is your body warning you to avoid that activity for another week until the pain goes down. You may drive when you are  no longer taking prescription pain medication, you can comfortably wear a seatbelt, and you can safely maneuver your car and apply brakes. You may have sexual intercourse when it is comfortable.  FOLLOW UP in our office Please call [facility] at [phone] to set up an appointment to see your surgeon in the office for a follow-up appointment approximately 2-3 weeks after your surgery. Make sure that you call for this appointment the day you arrive home to insure a convenient appointment time. 10. IF YOU HAVE DISABILITY OR FAMILY LEAVE FORMS, BRING THEM TO THE OFFICE FOR PROCESSING.  DO NOT GIVE THEM TO YOUR DOCTOR.   WHEN TO CALL US [phone]: Poor pain control Reactions / problems with new medications (rash/itching, nausea, etc)  Fever over 101.5 F (38.5 C) Inability to urinate Nausea and/or vomiting Worsening swelling or bruising Continued bleeding from incision. Increased pain, redness, or drainage from the incision   The clinic staff is available to answer your questions during regular business hours (8:30am-5pm).  Please don't hesitate to call and ask to speak to one of our nurses for clinical concerns.   If you have a medical emergency, go to the nearest emergency room or call 911.  A surgeon from [facility] is always on call at the hospitals   [facility] [address], [address], [place], [place]  [address] ? MAIN: [phone] ? TOLL FREE: [phone] ?  FAX [phone] [email or web address]   

## 2024-01-24 NOTE — Progress Notes (Signed)
 Caitlin Anthony 10:44 AM  Subjective: Patient seen and examined and she is doing better and wants to advance her diet and her pain is better and she has no new complaints and wants to go home  Objective: Vital signs stable afebrile no acute distress abdomen is soft nontender to make CBC okay liver tests decreasing  Assessment: Probably passed CBD stone  Plan: Okay with me to go home particularly if tolerates advance diet and recommend close surgical follow-up and repeat liver tests as an outpatient and either myself or Dr. Burnette happy to see back in the office if needed to discuss EUS or ERCP but if she has no further symptoms and liver tests resolve okay to just follow clinically and please call us  if we can be of any further assistance with this hospital stay  [facility]  office [phone] After 5PM or if no answer call [phone]

## 2024-01-24 NOTE — Progress Notes (Signed)
 H&P Note     Subjective: Patient reports min pain and nausea this AM. Had a BM  Objective: Vital signs in last 24 hours: Temp:  [98.3 F (36.8 C)-98.8 F (37.1 C)] 98.3 F (36.8 C) (06/22 0515) Pulse Rate:  [71-92] 92 (06/22 0515) Resp:  [14-17] 17 (06/22 0515) BP: (116-132)/(59-85) 116/73 (06/22 0515) SpO2:  [99 %-100 %] 99 % (06/22 0515) Last BM Date : 01/19/24  Intake/Output from previous day: 06/21 0701 - 06/22 0700 In: 360 [P.O.:360] Out: -  Intake/Output this shift: No intake/output data recorded.  PE: General: pleasant, WD, morbidly obese female who is clearly uncomfortable HEENT: Sclera are anicteric.  Abd: soft, less TTP in epigastrium and RUQ, no peritonitis, ND, incisions appear C/D/I    Lab Results:  Recent Labs    01/22/24 0443 01/23/24 0540  WBC 3.9* 3.8*  HGB 11.2* 10.5*  HCT 39.1 37.0  PLT 384 356   BMET Recent Labs    01/22/24 0443 01/23/24 0540  NA 136 135  K 3.9 3.6  CL 102 101  CO2 21* 22  GLUCOSE 102* 93  BUN 7 5*  CREATININE 0.80 0.85  CALCIUM 9.2 9.0   PT/INR No results for input(s): LABPROT, INR in the last 72 hours. CMP     Component Value Date/Time   NA 135 01/23/2024 0540   K 3.6 01/23/2024 0540   CL 101 01/23/2024 0540   CO2 22 01/23/2024 0540   GLUCOSE 93 01/23/2024 0540   BUN 5 (L) 01/23/2024 0540   CREATININE 0.85 01/23/2024 0540   CALCIUM 9.0 01/23/2024 0540   PROT 7.8 01/23/2024 0540   ALBUMIN 3.4 (L) 01/23/2024 0540   AST 152 (H) 01/23/2024 0540   ALT 309 (H) 01/23/2024 0540   ALKPHOS 238 (H) 01/23/2024 0540   BILITOT 1.4 (H) 01/23/2024 0540   GFRNONAA >60 01/23/2024 0540   GFRAA >60 10/25/2019 2255   Lipase     Component Value Date/Time   LIPASE 33 01/23/2024 0540       Studies/Results: US  LIVER DOPPLER Result Date: 01/22/2024 CLINICAL DATA:  Abnormal liver function tests EXAM: DUPLEX ULTRASOUND OF LIVER TECHNIQUE: Color and duplex Doppler ultrasound was performed to evaluate the  hepatic in-flow and out-flow vessels. COMPARISON:  MRI abdomen 01/21/2024 FINDINGS: Liver: No focal lesion. Diffusely increased parenchymal echogenicity. Normal hepatic contour without nodularity. No focal lesion, mass or intrahepatic biliary ductal dilatation. Main Portal Vein size: 0.8 cm Portal Vein Velocities Main Prox:  33 cm/sec Main Mid: 38 cm/sec Main Dist:  22 cm/sec Right: 14 cm/sec Left: 16 cm/sec Hepatic Vein Velocities Right:  23 cm/sec Middle:  19 cm/sec Left:  51 cm/sec IVC: Present and patent with normal respiratory phasicity. Hepatic Artery Velocity:  96 cm/sec Splenic Vein Velocity:  18 cm/sec Spleen: 10.3 cm x 10.3 cm x 12.6 cm with a total volume of 292 cm^3 (411 cm^3 is upper limit normal) Portal Vein Occlusion/Thrombus: No Splenic Vein Occlusion/Thrombus: No Ascites: None Varices: None IMPRESSION: 1. Diffuse increased echogenicity of the hepatic parenchyma is a nonspecific indicator of hepatocellular dysfunction, most commonly steatosis. 2. Patent portal vein with appropriate direction of flow. Electronically Signed   By: Aliene Lloyd M.D.   On: 01/22/2024 13:48    Anti-infectives: Anti-infectives (From admission, onward)    Start     Dose/Rate Route Frequency Ordered Stop   01/21/24 2300  piperacillin -tazobactam (ZOSYN ) IVPB 3.375 g  Status:  Discontinued        3.375 g 12.5 mL/hr over  240 Minutes Intravenous Every 8 hours 01/21/24 1647 01/22/24 1009   01/21/24 1700  piperacillin -tazobactam (ZOSYN ) IVPB 3.375 g        3.375 g 100 mL/hr over 30 Minutes Intravenous  Once 01/21/24 1647 01/21/24 1850        Assessment/Plan Epigastric abdominal pain with transaminitis and hyperbilirubinemia POD9 s/p laparoscopic cholecystectomy with IOC - Tbili up to 3.5, LFT's all trending down now.  Tbili 1.4 - MRCP with small volume residual simple fluid in gallbladder fossa which appears similar to CT 5 days ago, mild dilation of CBD but tapers smoothly to ampulla without calculus or other  obstruction, splenomegaly  - GI consulted and is following  - will start to advance diet - trend labs. Recheck this AM  FEN: fulls VTE: LMWH ID: [email or web address]  6/19>>6/20  LOS: 2 days      [name], MD  [facility] Surgery 01/24/2024, 7:28 AM Please see Amion for pager number during day hours 7:00am-4:30pm

## 2024-01-25 LAB — GLIA (IGA/G) + TTG IGA
Antigliadin Abs, IgA: 4 U (ref 0–19)
Gliadin IgG: 17 U (ref 0–19)
Tissue Transglutaminase Ab, IgA: <2 U/mL (ref 0–3)

## 2024-01-25 LAB — IMMUNOGLOBULINS A/E/G/M, SERUM
IgA: 126 mg/dL (ref 87–352)
IgE (Immunoglobulin E), Serum: 122 [IU]/mL (ref 6–495)
IgG (Immunoglobin G), Serum: 1918 mg/dL — ABNORMAL HIGH (ref 586–1602)
IgM (Immunoglobulin M), Srm: 168 mg/dL (ref 26–217)

## 2024-01-26 ENCOUNTER — Other Ambulatory Visit: Payer: Self-pay

## 2024-01-26 ENCOUNTER — Emergency Department (HOSPITAL_COMMUNITY)

## 2024-01-26 ENCOUNTER — Inpatient Hospital Stay (HOSPITAL_COMMUNITY)
Admission: EM | Admit: 2024-01-26 | Discharge: 2024-01-29 | DRG: 394 | Disposition: A | Discharge status: Home / Self Care | Attending: Internal Medicine | Admitting: Internal Medicine

## 2024-01-26 ENCOUNTER — Encounter (HOSPITAL_COMMUNITY): Payer: Self-pay | Admitting: Emergency Medicine

## 2024-01-26 DIAGNOSIS — K5090 Crohn's disease, unspecified, without complications: Secondary | ICD-10-CM | POA: Diagnosis present

## 2024-01-26 DIAGNOSIS — K8050 Calculus of bile duct without cholangitis or cholecystitis without obstruction: Secondary | ICD-10-CM | POA: Diagnosis not present

## 2024-01-26 DIAGNOSIS — F419 Anxiety disorder, unspecified: Secondary | ICD-10-CM | POA: Diagnosis present

## 2024-01-26 DIAGNOSIS — E66813 Obesity, class 3: Secondary | ICD-10-CM | POA: Diagnosis present

## 2024-01-26 DIAGNOSIS — Z9049 Acquired absence of other specified parts of digestive tract: Secondary | ICD-10-CM | POA: Diagnosis not present

## 2024-01-26 DIAGNOSIS — R748 Abnormal levels of other serum enzymes: Secondary | ICD-10-CM | POA: Diagnosis present

## 2024-01-26 DIAGNOSIS — Z79899 Other long term (current) drug therapy: Secondary | ICD-10-CM

## 2024-01-26 DIAGNOSIS — J45909 Unspecified asthma, uncomplicated: Secondary | ICD-10-CM | POA: Diagnosis present

## 2024-01-26 DIAGNOSIS — Z6841 Body Mass Index (BMI) 40.0 and over, adult: Secondary | ICD-10-CM | POA: Diagnosis not present

## 2024-01-26 DIAGNOSIS — K8042 Calculus of bile duct with acute cholecystitis without obstruction: Principal | ICD-10-CM | POA: Diagnosis present

## 2024-01-26 DIAGNOSIS — K219 Gastro-esophageal reflux disease without esophagitis: Secondary | ICD-10-CM | POA: Diagnosis present

## 2024-01-26 DIAGNOSIS — E871 Hypo-osmolality and hyponatremia: Secondary | ICD-10-CM | POA: Diagnosis present

## 2024-01-26 DIAGNOSIS — K759 Inflammatory liver disease, unspecified: Secondary | ICD-10-CM | POA: Diagnosis not present

## 2024-01-26 DIAGNOSIS — J4489 Other specified chronic obstructive pulmonary disease: Secondary | ICD-10-CM | POA: Diagnosis present

## 2024-01-26 DIAGNOSIS — Z825 Family history of asthma and other chronic lower respiratory diseases: Secondary | ICD-10-CM

## 2024-01-26 DIAGNOSIS — Z885 Allergy status to narcotic agent status: Secondary | ICD-10-CM

## 2024-01-26 DIAGNOSIS — R7989 Other specified abnormal findings of blood chemistry: Secondary | ICD-10-CM | POA: Diagnosis present

## 2024-01-26 DIAGNOSIS — Z833 Family history of diabetes mellitus: Secondary | ICD-10-CM

## 2024-01-26 DIAGNOSIS — K9186 Retained cholelithiasis following cholecystectomy: Principal | ICD-10-CM | POA: Diagnosis present

## 2024-01-26 DIAGNOSIS — E876 Hypokalemia: Secondary | ICD-10-CM | POA: Diagnosis present

## 2024-01-26 DIAGNOSIS — Z91018 Allergy to other foods: Secondary | ICD-10-CM

## 2024-01-26 DIAGNOSIS — M549 Dorsalgia, unspecified: Secondary | ICD-10-CM | POA: Diagnosis present

## 2024-01-26 DIAGNOSIS — R7401 Elevation of levels of liver transaminase levels: Secondary | ICD-10-CM

## 2024-01-26 DIAGNOSIS — Z888 Allergy status to other drugs, medicaments and biological substances status: Secondary | ICD-10-CM | POA: Diagnosis not present

## 2024-01-26 DIAGNOSIS — Z7951 Long term (current) use of inhaled steroids: Secondary | ICD-10-CM | POA: Diagnosis not present

## 2024-01-26 DIAGNOSIS — R1011 Right upper quadrant pain: Secondary | ICD-10-CM

## 2024-01-26 DIAGNOSIS — K838 Other specified diseases of biliary tract: Principal | ICD-10-CM

## 2024-01-26 DIAGNOSIS — J449 Chronic obstructive pulmonary disease, unspecified: Secondary | ICD-10-CM | POA: Diagnosis not present

## 2024-01-26 DIAGNOSIS — D649 Anemia, unspecified: Secondary | ICD-10-CM | POA: Diagnosis present

## 2024-01-26 LAB — URINALYSIS, ROUTINE W REFLEX MICROSCOPIC
Bacteria, UA: NONE SEEN
Glucose, UA: NEGATIVE mg/dL
Ketones, ur: NEGATIVE mg/dL
Leukocytes,Ua: NEGATIVE
Nitrite: NEGATIVE
Protein, ur: 100 mg/dL — AB
RBC / HPF: >50 RBC/hpf (ref 0–5)
Specific Gravity, Urine: 1.033 — ABNORMAL HIGH (ref 1.005–1.030)
pH: 5 (ref 5.0–8.0)

## 2024-01-26 LAB — CBC
HCT: 34.8 % — ABNORMAL LOW (ref 36.0–46.0)
Hemoglobin: 10 g/dL — ABNORMAL LOW (ref 12.0–15.0)
MCH: 21.5 pg — ABNORMAL LOW (ref 26.0–34.0)
MCHC: 28.7 g/dL — ABNORMAL LOW (ref 30.0–36.0)
MCV: 74.8 fL — ABNORMAL LOW (ref 80.0–100.0)
Platelets: 369 10*3/uL (ref 150–400)
RBC: 4.65 MIL/uL (ref 3.87–5.11)
RDW: 17.4 % — ABNORMAL HIGH (ref 11.5–15.5)
WBC: 5.6 10*3/uL (ref 4.0–10.5)
nRBC: 0 % (ref 0.0–0.2)

## 2024-01-26 LAB — COMPREHENSIVE METABOLIC PANEL WITH GFR
ALT: 174 U/L — ABNORMAL HIGH (ref 0–44)
AST: 136 U/L — ABNORMAL HIGH (ref 15–41)
Albumin: 3.6 g/dL (ref 3.5–5.0)
Alkaline Phosphatase: 271 U/L — ABNORMAL HIGH (ref 38–126)
Anion gap: 10 (ref 5–15)
BUN: 8 mg/dL (ref 6–20)
CO2: 21 mmol/L — ABNORMAL LOW (ref 22–32)
Calcium: 8.8 mg/dL — ABNORMAL LOW (ref 8.9–10.3)
Chloride: 106 mmol/L (ref 98–111)
Creatinine, Ser: 0.68 mg/dL (ref 0.44–1.00)
GFR, Estimated: >60 mL/min (ref >60)
Glucose, Bld: 131 mg/dL — ABNORMAL HIGH (ref 70–99)
Potassium: 3.6 mmol/L (ref 3.5–5.1)
Sodium: 137 mmol/L (ref 135–145)
Total Bilirubin: 1 mg/dL (ref 0.0–1.2)
Total Protein: 8.1 g/dL (ref 6.5–8.1)

## 2024-01-26 LAB — LIPASE, BLOOD: Lipase: 27 U/L (ref 11–51)

## 2024-01-26 LAB — PREGNANCY, URINE: Preg Test, Ur: NEGATIVE

## 2024-01-26 MED ORDER — FLUTICASONE FUROATE-VILANTEROL 200-25 MCG/ACT IN AEPB
1.0000 | INHALATION_SPRAY | Freq: Every day | RESPIRATORY_TRACT | Status: DC
  Administered 2024-01-27 – 2024-01-29 (×3): 1 via RESPIRATORY_TRACT
  Filled 2024-01-26: qty 28

## 2024-01-26 MED ORDER — HYDROMORPHONE HCL 1 MG/ML IJ SOLN
1.0000 mg | INTRAMUSCULAR | Status: AC | PRN
  Administered 2024-01-26 (×2): 1 mg via INTRAVENOUS
  Filled 2024-01-26 (×2): qty 1

## 2024-01-26 MED ORDER — ALUM & MAG HYDROXIDE-SIMETH 200-200-20 MG/5ML PO SUSP
30.0000 mL | Freq: Once | ORAL | Status: AC
  Administered 2024-01-26: 30 mL via ORAL
  Filled 2024-01-26: qty 30

## 2024-01-26 MED ORDER — IOHEXOL 300 MG/ML  SOLN
100.0000 mL | Freq: Once | INTRAMUSCULAR | Status: AC | PRN
  Administered 2024-01-26: 100 mL via INTRAVENOUS

## 2024-01-26 MED ORDER — ONDANSETRON HCL 4 MG PO TABS: 4.0000 mg | ORAL_TABLET | Freq: Four times a day (QID) | ORAL | Status: DC | PRN

## 2024-01-26 MED ORDER — ENOXAPARIN SODIUM 80 MG/0.8ML IJ SOSY
80.0000 mg | PREFILLED_SYRINGE | INTRAMUSCULAR | Status: DC
  Administered 2024-01-26 – 2024-01-28 (×3): 80 mg via SUBCUTANEOUS
  Filled 2024-01-26 (×3): qty 0.8

## 2024-01-26 MED ORDER — HYDROMORPHONE HCL 1 MG/ML IJ SOLN
INTRAMUSCULAR | Status: AC
  Administered 2024-01-26: 1 mg via INTRAVENOUS
  Filled 2024-01-26: qty 1

## 2024-01-26 MED ORDER — IBUPROFEN 200 MG PO TABS: 400.0000 mg | ORAL_TABLET | Freq: Four times a day (QID) | ORAL | Status: DC | PRN

## 2024-01-26 MED ORDER — LORAZEPAM 2 MG/ML IJ SOLN
1.0000 mg | INTRAMUSCULAR | Status: DC | PRN
  Administered 2024-01-26 – 2024-01-27 (×3): 1 mg via INTRAVENOUS
  Filled 2024-01-26 (×3): qty 1

## 2024-01-26 MED ORDER — DICYCLOMINE HCL 20 MG PO TABS
20.0000 mg | ORAL_TABLET | Freq: Two times a day (BID) | ORAL | Status: DC | PRN
  Administered 2024-01-26: 20 mg via ORAL
  Filled 2024-01-26: qty 1

## 2024-01-26 MED ORDER — HYDROMORPHONE HCL 1 MG/ML IJ SOLN
1.0000 mg | Freq: Once | INTRAMUSCULAR | Status: AC
  Administered 2024-01-26: 1 mg via INTRAVENOUS
  Filled 2024-01-26: qty 1

## 2024-01-26 MED ORDER — PROCHLORPERAZINE EDISYLATE 10 MG/2ML IJ SOLN
10.0000 mg | Freq: Once | INTRAMUSCULAR | Status: AC
  Administered 2024-01-26: 10 mg via INTRAVENOUS
  Filled 2024-01-26: qty 2

## 2024-01-26 MED ORDER — HYDROMORPHONE HCL 1 MG/ML IJ SOLN
1.0000 mg | INTRAMUSCULAR | Status: DC | PRN
  Administered 2024-01-26 – 2024-01-28 (×11): 1 mg via INTRAVENOUS
  Filled 2024-01-26 (×11): qty 1

## 2024-01-26 MED ORDER — ONDANSETRON HCL 4 MG/2ML IJ SOLN
4.0000 mg | Freq: Four times a day (QID) | INTRAMUSCULAR | Status: DC | PRN
  Administered 2024-01-27 – 2024-01-28 (×3): 4 mg via INTRAVENOUS
  Filled 2024-01-26 (×3): qty 2

## 2024-01-26 MED ORDER — LIDOCAINE VISCOUS HCL 2 % MT SOLN
15.0000 mL | Freq: Once | OROMUCOSAL | Status: AC
  Administered 2024-01-26: 15 mL via ORAL
  Filled 2024-01-26: qty 15

## 2024-01-26 MED ORDER — OXYCODONE HCL 5 MG PO TABS: 5.0000 mg | ORAL_TABLET | Freq: Four times a day (QID) | ORAL | Status: DC | PRN

## 2024-01-26 MED ORDER — TECHNETIUM TC 99M MEBROFENIN IV KIT
5.5000 | PACK | Freq: Once | INTRAVENOUS | Status: AC
Start: 2024-01-26 — End: 2024-01-26
  Administered 2024-01-26: 5.5 via INTRAVENOUS

## 2024-01-26 MED ORDER — SODIUM CHLORIDE 0.9 % IV BOLUS
1000.0000 mL | Freq: Once | INTRAVENOUS | Status: AC
  Administered 2024-01-26: 1000 mL via INTRAVENOUS

## 2024-01-26 MED ORDER — HYDROMORPHONE HCL 2 MG PO TABS
2.0000 mg | ORAL_TABLET | ORAL | Status: DC | PRN
  Administered 2024-01-26: 2 mg via ORAL
  Filled 2024-01-26: qty 1

## 2024-01-26 MED ORDER — HYDROMORPHONE HCL 1 MG/ML IJ SOLN
0.5000 mg | INTRAMUSCULAR | Status: DC | PRN
  Administered 2024-01-26: 0.5 mg via INTRAVENOUS
  Filled 2024-01-26: qty 1

## 2024-01-26 MED ORDER — LACTATED RINGERS IV BOLUS
500.0000 mL | Freq: Once | INTRAVENOUS | Status: AC
  Administered 2024-01-26: 500 mL via INTRAVENOUS

## 2024-01-26 MED ORDER — PANTOPRAZOLE SODIUM 40 MG IV SOLR
40.0000 mg | INTRAVENOUS | Status: DC
  Administered 2024-01-26 – 2024-01-28 (×3): 40 mg via INTRAVENOUS
  Filled 2024-01-26 (×3): qty 10

## 2024-01-26 MED ORDER — MORPHINE SULFATE (PF) 2 MG/ML IV SOLN
2.0000 mg | INTRAVENOUS | Status: DC | PRN
  Administered 2024-01-26: 2 mg via INTRAVENOUS
  Filled 2024-01-26: qty 1

## 2024-01-26 MED ORDER — TRAZODONE HCL 50 MG PO TABS: 25.0000 mg | ORAL_TABLET | Freq: Every evening | ORAL | Status: DC | PRN

## 2024-01-26 NOTE — Progress Notes (Addendum)
 H&P Note     Subjective: Patient is familiar to our service, recently underwent laparoscopic cholecystectomy with IOC by Dr. Stevie on 01/15/24 for acute cholecystitis and was discharged from PACU on POD0. Patient returned to the ED on 01/16/24 with abdominal pain, labs showed mild elevation in AST/ALT which is expected post-operatively but no other significant abnormalities. CT showed normal post-op findings. She ended up back in the ED 6/17 with persistent abdominal pain and labs showed slight increased in AST and Alk Phos, stable ALT, normal Tbili. Patient's pain improved and she was discharged home.   She was seen again on 6/19 and required admission due to elevated LFTs and bilirubin of 3.1. MRCP was done which was negative for CBD stone. Small amt fluid in GB fossa. GI was consulted. LFts downtrended and the patient clinically improved. She was discharged home on 6/22.   She states she was doing ok at home , eating a bland diet, and having bowel function. Last night she ate ground [place] around 5:30 PM and then at 9-10 PM developed epigastric and RUQ pain that radiates to her back. Associated nausea but no emesis.   Objective: Vital signs in last 24 hours: Temp:  [97.9 F (36.6 C)-98.3 F (36.8 C)] 98.1 F (36.7 C) (06/24 0729) Pulse Rate:  [71-84] 71 (06/24 0729) Resp:  [17-20] 17 (06/24 0729) BP: (124-181)/(70-111) 156/99 (06/24 0729) SpO2:  [96 %-99 %] 96 % (06/24 0729) Weight:  [158.8 kg] 158.8 kg (06/24 0123)    Intake/Output from previous day: No intake/output data recorded. Intake/Output this shift: Total I/O In: 1000 [IV Piggyback:1000] Out: -   PE: General: pleasant, WD, morbidly obese female who is uncomfortable but non-toxic appearing HEENT: Sclera are anicteric.  Abd: soft, TTP in epigastrium and RUQ, no peritonitis, ND, incisions appear C/D/I    Lab Results:  Recent Labs    01/26/24 0125  WBC 5.6  HGB 10.0*  HCT 34.8*  PLT 369   BMET Recent Labs     01/24/24 0819 01/26/24 0125  NA 137 137  K 3.7 3.6  CL 106 106  CO2 20* 21*  GLUCOSE 93 131*  BUN 6 8  CREATININE 0.77 0.68  CALCIUM 9.1 8.8*   PT/INR No results for input(s): LABPROT, INR in the last 72 hours. CMP     Component Value Date/Time   NA 137 01/26/2024 0125   K 3.6 01/26/2024 0125   CL 106 01/26/2024 0125   CO2 21 (L) 01/26/2024 0125   GLUCOSE 131 (H) 01/26/2024 0125   BUN 8 01/26/2024 0125   CREATININE 0.68 01/26/2024 0125   CALCIUM 8.8 (L) 01/26/2024 0125   PROT 8.1 01/26/2024 0125   ALBUMIN 3.6 01/26/2024 0125   AST 136 (H) 01/26/2024 0125   ALT 174 (H) 01/26/2024 0125   ALKPHOS 271 (H) 01/26/2024 0125   BILITOT 1.0 01/26/2024 0125   GFRNONAA >60 01/26/2024 0125   GFRAA >60 10/25/2019 2255   Lipase     Component Value Date/Time   LIPASE 27 01/26/2024 0125       Studies/Results: CT ABDOMEN PELVIS W CONTRAST Result Date: 01/26/2024 EXAM: CT ABDOMEN AND PELVIS WITH CONTRAST 01/26/2024 06:10:39 AM TECHNIQUE: CT of the abdomen and pelvis was performed with the administration of intravenous contrast. Multiplanar reformatted images are provided for review. Automated exposure control, iterative reconstruction, and/or weight based adjustment of the mA/kV was utilized to reduce the radiation dose to as low as reasonably achievable. COMPARISON: CT of the abdomen and pelvis  with contrast 01/16/2024. MRI of the abdomen 01/21/2024. Liver ultrasound 01/22/2024. CLINICAL HISTORY: Abdominal pain, post-op. Abdominal pain x 2 days. Patient report worsening RUQ abdominal pain after eating [place] tonight. Patient report taking PRN pain medication without relief. Patient recently Discharge 2 days ago with same complain. Hx cholecystectomy 11 days ago, hx of Crohn's. FINDINGS: LOWER CHEST: Mild dependent atelectasis is present in both lung bases. LIVER: The fluid density collection in the gallbladder fossa has increased in size measuring up to 3 cm. No significant  extrahepatic fluid collection is present. Mild intrahepatic biliary dilation is noted. GALLBLADDER AND BILE DUCTS: Cholecystectomy is again noted. The dilated common bile duct is slightly more prominent than on the prior studies, and now measuring up to 12 mm. No obstructing lesion is present. SPLEEN: No acute abnormality. PANCREAS: The pancreatic duct is normal. The pancreas normally enhances. No mass lesion or cyst is present. ADRENAL GLANDS: No acute abnormality. KIDNEYS, URETERS AND BLADDER: No stones in the kidneys or ureters. No hydronephrosis. No perinephric or periureteral stranding. Urinary bladder is unremarkable. GI AND BOWEL: Stomach demonstrates no acute abnormality. There is no bowel obstruction. No bowel wall thickening. PERITONEUM AND RETROPERITONEUM: No ascites. No free air. A fat-containing periumbilical hernia measures 17 mm, stable VASCULATURE: Aorta is normal in caliber. LYMPH NODES: No lymphadenopathy. REPRODUCTIVE ORGANS: No acute abnormality. BONES AND SOFT TISSUES: No acute osseous abnormality. No focal soft tissue abnormality. IMPRESSION: 1. Mild intrahepatic biliary dilation and progressively dilated common bile duct, now measuring up to 12 mm, without obstructing lesion. This corresponds to the obstructive lab profile. 2. Increased size of fluid density collection in the gallbladder fossa, now measuring up to 3 cm. This most likely represents a postoperative seroma. Biliary leak is considered less likely without fluid elsewhere. Electronically signed by: Lonni Necessary MD 01/26/2024 06:34 AM EDT RP Workstation: [id number]    Anti-infectives: Anti-infectives (From admission, onward)    None         Latest Ref Rng & Units 01/26/2024    1:25 AM 01/24/2024    8:19 AM 01/23/2024    5:40 AM  Hepatic Function  Total Protein 6.5 - 8.1 g/dL 8.1  8.2  7.8   Albumin 3.5 - 5.0 g/dL 3.6  3.6  3.4   AST 15 - 41 U/L 136  89  152   ALT 0 - 44 U/L 174  222  309   Alk Phosphatase 38 -  126 U/L 271  211  238   Total Bilirubin 0.0 - 1.2 mg/dL 1.0  0.9  1.4   Bilirubin, Direct 0.0 - 0.2 mg/dL   0.4       Assessment/Plan Epigastric abdominal pain with transaminitis 3 cm RUQ fluid collection POD11 s/p laparoscopic cholecystectomy with IOC - afebrile, hemodynamically stable - LFTs are slightly increased as above, bilirubin is 1.0 - CBC with hgb stable at 10, no leukocytosis - CT scan today shows interval increase in RUQ fluid collection to 3 cm, more prominent common bile duct and mild intrahepatic biliary dilation without obstructing lesion. Bowel appears normal. - given interval increase in gallbladder fossa fluid collection I will order a HIDA to exclude a biloma/cystic stump leak. I have also asked GI to see the patient for consideration of EUS/ERCP if HIDA is negative. If HIDA is negative then I will ask the medical service to admit the patient.  - no emergent role for surgery, we will follow. - no role for abx at present await HIDA and if positive will  start abx - PRN analgesics and antiemetics  Other PMH:  Crohn's Asthma Obesity    LOS: 0 days      Almarie GORMAN Pringle, [facility] Surgery 01/26/2024, 8:16 AM Please see Amion for pager number during day hours 7:00am-4:30pm

## 2024-01-26 NOTE — ED Notes (Signed)
 Patient transported to IMAGING

## 2024-01-26 NOTE — H&P (Signed)
 History and Physical  Caitlin Anthony FMW:[id number] DOB: August 24, 2000 DOA: 01/26/2024  PCP: Loreli Elyn SAILOR, MD   Chief Complaint: Abdominal pain  HPI: Caitlin Anthony is a 23 y.o. female with medical history significant for morbid obesity status post lap chole with IOC on 01/15/2024 being admitted to the hospital with recurrent and worsening abdominal pain.  She returned to the hospital and was admitted by the surgical service 6/19 to 6/22, was followed by GI as well and during that hospitalization had MRCP without abnormality.  She felt better with improved pain and was tolerating a bland diet and was discharged home on 6/22.  States that she was doing well for a short period of time, but pain has recurred and is severe.  She has some nausea without vomiting, denies fevers, shortness of breath or other concerns.  CT done today shows progressively dilated biliary duct, but HIDA scan is unremarkable.  She was seen in consultation by general surgery and gastroenterology.  Review of Systems: Please see HPI for pertinent positives and negatives. A complete 10 system review of systems are otherwise negative.  Past Medical History:  Diagnosis Date   Asthma    Crohn's disease [facility])    Past Surgical History:  Procedure Laterality Date   CHOLECYSTECTOMY N/A 01/15/2024   Procedure: LAPAROSCOPIC CHOLECYSTECTOMY WITH INTRAOPERATIVE CHOLANGIOGRAM;  Surgeon: Kinsinger, Herlene Righter, MD;  Location: [facility] ORS;  Service: General;  Laterality: N/A;   NO PAST SURGERIES     Social History:  reports that she has never smoked. She has never used smokeless tobacco. She reports that she does not drink alcohol and does not use drugs.  Allergies  Allergen Reactions   Haloperidol  Decanoate Anxiety   Hydrocodone Itching and Rash   Pineapple Swelling    Also hives    Family History  Problem Relation Age of Onset   Asthma Other    Diabetes Other      Prior to Admission medications   Medication Sig Start Date End Date Taking?  Authorizing Provider  acetaminophen  (TYLENOL ) 500 MG tablet Take 500 mg by mouth every 6 (six) hours as needed for moderate pain (pain score 4-6).   Yes [provider]  albuterol  (VENTOLIN  HFA) 108 (90 Base) MCG/ACT inhaler Inhale 2 puffs into the lungs every 4 (four) hours as needed for wheezing or shortness of breath (cough). 09/01/23  Yes Roselyn Carlin NOVAK, MD  budesonide-formoterol [facility]) 160-4.5 MCG/ACT inhaler Inhale 2 puffs into the lungs 2 (two) times daily as needed. 09/15/23 09/14/24 Yes [provider]  dicyclomine  (BENTYL ) 20 MG tablet Take 1 tablet (20 mg total) by mouth 2 (two) times daily. Patient taking differently: Take 20 mg by mouth 2 (two) times daily as needed for spasms. 01/16/24  Yes Palumbo, April, MD  methocarbamol  (ROBAXIN ) 500 MG tablet Take 1 tablet (500 mg total) by mouth 2 (two) times daily. 01/16/24  Yes Palumbo, April, MD  metoCLOPramide (REGLAN) 10 MG tablet Take 10 mg by mouth every 6 (six) hours as needed for vomiting or nausea. 01/18/24 01/28/24 Yes [provider]  ondansetron  (ZOFRAN -ODT) 8 MG disintegrating tablet 8mg  ODT q8 hours prn nausea 01/16/24  Yes Palumbo, April, MD  oxyCODONE  (OXY IR/ROXICODONE ) 5 MG immediate release tablet Take 1 tablet (5 mg total) by mouth every 6 (six) hours as needed for severe pain (pain score 7-10). 01/15/24  Yes Kinsinger, Herlene Righter, MD  acetaminophen  (TYLENOL ) 325 MG tablet Take 2 tablets (650 mg total) by mouth every 6 (six) hours as  needed for up to 30 doses. Patient not taking: Reported on 01/26/2024 01/31/22   Cottie Donnice PARAS, MD  ibuprofen  (ADVIL ) 800 MG tablet Take 1 tablet (800 mg total) by mouth every 8 (eight) hours as needed. 01/15/24   Kinsinger, Herlene Righter, MD  LOMAIRA 8 MG TABS Take 1 tablet by mouth. Patient not taking: Reported on 01/21/2024 10/25/23   [provider]    Physical Exam: BP 129/63 (BP Location: Right Wrist)   Pulse 60   Temp 98.3 F (36.8 C) (Oral)   Resp 14    Ht 5' 4 (1.626 m)   Wt (!) 158.8 kg   LMP 01/11/2024 Comment: negative urine preg test 01/26/23  SpO2 97%   BMI 60.09 kg/m  General: Morbidly obese female appearing her stated age, doubled over at the bedside in pain. Cardiovascular: RRR, no murmurs or rubs, no peripheral edema  Respiratory: clear to auscultation bilaterally, no wheezes, no crackles  Abdomen: Due to severe acute pain, patient declines abdominal exam Skin: dry, no rashes  Musculoskeletal: no joint effusions, normal range of motion  Psychiatric: appropriate affect, normal speech  Neurologic: extraocular muscles intact, clear speech, moving all extremities with intact sensorium         Labs on Admission:  Basic Metabolic Panel: Recent Labs  Lab 01/21/24 1427 01/22/24 0443 01/23/24 0540 01/24/24 0819 01/26/24 0125  NA 133* 136 135 137 137  K 3.8 3.9 3.6 3.7 3.6  CL 101 102 101 106 106  CO2 22 21* 22 20* 21*  GLUCOSE 116* 102* 93 93 131*  BUN 7 7 5* 6 8  CREATININE 0.79 0.80 0.85 0.77 0.68  CALCIUM 9.2 9.2 9.0 9.1 8.8*   Liver Function Tests: Recent Labs  Lab 01/21/24 1427 01/22/24 0443 01/23/24 0540 01/24/24 0819 01/26/24 0125  AST 401* 299* 152* 89* 136*  ALT 500* 435* 309* 222* 174*  ALKPHOS 288* 271* 238* 211* 271*  BILITOT 3.1* 3.5* 1.4* 0.9 1.0  PROT 8.8* 8.3* 7.8 8.2* 8.1  ALBUMIN 3.8 3.7 3.4* 3.6 3.6   Recent Labs  Lab 01/19/24 2356 01/21/24 1427 01/23/24 0540 01/26/24 0125  LIPASE 17 24 33 27   No results for input(s): AMMONIA in the last 168 hours. CBC: Recent Labs  Lab 01/19/24 2356 01/21/24 1427 01/22/24 0443 01/23/24 0540 01/26/24 0125  WBC 6.0 4.1 3.9* 3.8* 5.6  NEUTROABS  --  2.3  --   --   --   HGB 10.1* 11.3* 11.2* 10.5* 10.0*  HCT 33.6* 38.8 39.1 37.0 34.8*  MCV 71.2* 74.5* 75.5* 75.8* 74.8*  PLT 394 412* 384 356 369   Cardiac Enzymes: No results for input(s): CKTOTAL, CKMB, CKMBINDEX, TROPONINI in the last 168 hours. BNP (last 3 results) No results  for input(s): BNP in the last 8760 hours.  ProBNP (last 3 results) No results for input(s): PROBNP in the last 8760 hours.  CBG: No results for input(s): GLUCAP in the last 168 hours.  Radiological Exams on Admission: CT ABDOMEN PELVIS W CONTRAST Result Date: 01/26/2024 EXAM: CT ABDOMEN AND PELVIS WITH CONTRAST 01/26/2024 06:10:39 AM TECHNIQUE: CT of the abdomen and pelvis was performed with the administration of intravenous contrast. Multiplanar reformatted images are provided for review. Automated exposure control, iterative reconstruction, and/or weight based adjustment of the mA/kV was utilized to reduce the radiation dose to as low as reasonably achievable. COMPARISON: CT of the abdomen and pelvis with contrast 01/16/2024. MRI of the abdomen 01/21/2024. Liver ultrasound 01/22/2024. CLINICAL HISTORY: Abdominal pain, post-op.  Abdominal pain x 2 days. Patient report worsening RUQ abdominal pain after eating [place] tonight. Patient report taking PRN pain medication without relief. Patient recently Discharge 2 days ago with same complain. Hx cholecystectomy 11 days ago, hx of Crohn's. FINDINGS: LOWER CHEST: Mild dependent atelectasis is present in both lung bases. LIVER: The fluid density collection in the gallbladder fossa has increased in size measuring up to 3 cm. No significant extrahepatic fluid collection is present. Mild intrahepatic biliary dilation is noted. GALLBLADDER AND BILE DUCTS: Cholecystectomy is again noted. The dilated common bile duct is slightly more prominent than on the prior studies, and now measuring up to 12 mm. No obstructing lesion is present. SPLEEN: No acute abnormality. PANCREAS: The pancreatic duct is normal. The pancreas normally enhances. No mass lesion or cyst is present. ADRENAL GLANDS: No acute abnormality. KIDNEYS, URETERS AND BLADDER: No stones in the kidneys or ureters. No hydronephrosis. No perinephric or periureteral stranding. Urinary bladder is  unremarkable. GI AND BOWEL: Stomach demonstrates no acute abnormality. There is no bowel obstruction. No bowel wall thickening. PERITONEUM AND RETROPERITONEUM: No ascites. No free air. A fat-containing periumbilical hernia measures 17 mm, stable VASCULATURE: Aorta is normal in caliber. LYMPH NODES: No lymphadenopathy. REPRODUCTIVE ORGANS: No acute abnormality. BONES AND SOFT TISSUES: No acute osseous abnormality. No focal soft tissue abnormality. IMPRESSION: 1. Mild intrahepatic biliary dilation and progressively dilated common bile duct, now measuring up to 12 mm, without obstructing lesion. This corresponds to the obstructive lab profile. 2. Increased size of fluid density collection in the gallbladder fossa, now measuring up to 3 cm. This most likely represents a postoperative seroma. Biliary leak is considered less likely without fluid elsewhere. Electronically signed by: Lonni Necessary MD 01/26/2024 06:34 AM EDT RP Workstation: [id number]   Assessment/Plan Caitlin Anthony is a 23 y.o. female with medical history significant for morbid obesity status post lap chole with IOC on 01/15/2024 being admitted to the hospital with recurrent and worsening abdominal pain.  Abdominal pain-with elevated LFTs which initially improved during her last hospitalization and now worsening again.  Etiology is unclear, as HIDA scan without leak, she had MRCP with mild CBD dilation but no evidence of stricture, stone or other complication.  Has been seen in the ER by GI and general surgery. -Inpatient admission -Pain and nausea control  Abnormal LFTs-unclear etiology, will check acute hepatitis panel and Tylenol  level.  Has been seen by gastroenterology, who recommends trending LFTs and she may require ERCP if they continue to rise.  DVT prophylaxis: Lovenox      Code Status: Full Code  Consults called: GI, general surgery  Admission status: The appropriate patient status for this patient is INPATIENT. Inpatient  status is judged to be reasonable and necessary in order to provide the required intensity of service to ensure the patient's safety. The patient's presenting symptoms, physical exam findings, and initial radiographic and laboratory data in the context of their chronic comorbidities is felt to place them at high risk for further clinical deterioration. Furthermore, it is not anticipated that the patient will be medically stable for discharge from the hospital within 2 midnights of admission.    I certify that at the point of admission it is my clinical judgment that the patient will require inpatient hospital care spanning beyond 2 midnights from the point of admission due to high intensity of service, high risk for further deterioration and high frequency of surveillance required  Time spent: 56 minutes  Zayan Delvecchio CHRISTELLA Gail MD [facility] Pager [phone]  If 7PM-7AM, please contact night-coverage [email or web address] Password [email or web address]  01/26/2024, 1:54 PM

## 2024-01-26 NOTE — Consult Note (Signed)
 Referring Provider: Dr. Mannie Primary Care Physician:  Loreli Elyn SAILOR, MD Primary Gastroenterologist:  Sampson  Reason for Consultation:  Abdominal pain; Elevated LFTs  HPI: Caitlin Anthony is a 23 y.o. female who is s/p lap chole with negative IOC on 01/15/24 and has followed up 3 times since surgery with worsened abdominal pain. MRCP negative for CBD stones on 6/19 and repeat done this admit that was negative again. CT this admit showed mild intrahepatic biliary dilation and dilated CBD up to 12 mm without a definite CBD stone. HIDA scan today pending. Patient is crouched in a chair with her head on the bed writhing in pain in ER. TB 1, ALP 271 (211), AST 136 (89), ALT 174 (222), lipase 27.  Past Medical History:  Diagnosis Date   Asthma    Crohn's disease [facility])     Past Surgical History:  Procedure Laterality Date   CHOLECYSTECTOMY N/A 01/15/2024   Procedure: LAPAROSCOPIC CHOLECYSTECTOMY WITH INTRAOPERATIVE CHOLANGIOGRAM;  Surgeon: Kinsinger, Herlene Righter, MD;  Location: [facility];  Service: General;  Laterality: N/A;   NO PAST SURGERIES      Prior to Admission medications   Medication Sig Start Date End Date Taking? Authorizing Provider  acetaminophen  (TYLENOL ) 500 MG tablet Take 500 mg by mouth every 6 (six) hours as needed for moderate pain (pain score 4-6).   Yes [provider]  albuterol  (VENTOLIN  HFA) 108 (90 Base) MCG/ACT inhaler Inhale 2 puffs into the lungs every 4 (four) hours as needed for wheezing or shortness of breath (cough). 09/01/23  Yes Roselyn Carlin NOVAK, MD  budesonide-formoterol [facility]) 160-4.5 MCG/ACT inhaler Inhale 2 puffs into the lungs 2 (two) times daily as needed. 09/15/23 09/14/24 Yes [provider]  dicyclomine  (BENTYL ) 20 MG tablet Take 1 tablet (20 mg total) by mouth 2 (two) times daily. Patient taking differently: Take 20 mg by mouth 2 (two) times daily as needed for spasms. 01/16/24  Yes Palumbo, April, MD  methocarbamol  (ROBAXIN ) 500 MG  tablet Take 1 tablet (500 mg total) by mouth 2 (two) times daily. 01/16/24  Yes Palumbo, April, MD  metoCLOPramide (REGLAN) 10 MG tablet Take 10 mg by mouth every 6 (six) hours as needed for vomiting or nausea. 01/18/24 01/28/24 Yes [provider]  ondansetron  (ZOFRAN -ODT) 8 MG disintegrating tablet 8mg  ODT q8 hours prn nausea 01/16/24  Yes Palumbo, April, MD  oxyCODONE  (OXY IR/ROXICODONE ) 5 MG immediate release tablet Take 1 tablet (5 mg total) by mouth every 6 (six) hours as needed for severe pain (pain score 7-10). 01/15/24  Yes Kinsinger, Herlene Righter, MD  acetaminophen  (TYLENOL ) 325 MG tablet Take 2 tablets (650 mg total) by mouth every 6 (six) hours as needed for up to 30 doses. Patient not taking: Reported on 01/26/2024 01/31/22   Cottie Donnice PARAS, MD  ibuprofen  (ADVIL ) 800 MG tablet Take 1 tablet (800 mg total) by mouth every 8 (eight) hours as needed. 01/15/24   Kinsinger, Herlene Righter, MD  LOMAIRA 8 MG TABS Take 1 tablet by mouth. Patient not taking: Reported on 01/21/2024 10/25/23   [provider]    Scheduled Meds: Continuous Infusions:  lactated ringers      PRN Meds:.  Allergies as of 01/26/2024 - Review Complete 01/26/2024  Allergen Reaction Noted   Haloperidol  decanoate Anxiety 01/20/2024   Hydrocodone Itching and Rash 11/11/2022   Pineapple Swelling 04/28/2019    Family History  Problem Relation Age of Onset   Asthma Other    Diabetes Other  Social History   Socioeconomic History   Marital status: Single    Spouse name: Not on file   Number of children: Not on file   Years of education: Not on file   Highest education level: Not on file  Occupational History   Not on file  Tobacco Use   Smoking status: Never   Smokeless tobacco: Never  Vaping Use   Vaping status: Never Used  Substance and Sexual Activity   Alcohol use: Never   Drug use: Never   Sexual activity: Not on file  Other Topics Concern   Not on file  Social History Narrative   Not  on file   Social Drivers of Health   Financial Resource Strain: Not on file  Food Insecurity: No Food Insecurity (01/21/2024)   Hunger Vital Sign    Worried About Running Out of Food in the Last Year: Never true    Ran Out of Food in the Last Year: Never true  Transportation Needs: No Transportation Needs (01/21/2024)   PRAPARE - Administrator, Civil Service (Medical): No    Lack of Transportation (Non-Medical): No  Physical Activity: Not on file  Stress: Not on file  Social Connections: Not on file  Intimate Partner Violence: Not At Risk (01/21/2024)   Humiliation, Afraid, Rape, and Kick questionnaire    Fear of Current or Ex-Partner: No    Emotionally Abused: No    Physically Abused: No    Sexually Abused: No    Review of Systems: All negative except as stated above in HPI.  Physical Exam: Vital signs: Vitals:   01/26/24 0900 01/26/24 1227  BP: 124/65 129/63  Pulse: 66 60  Resp: 17 14  Temp:  98.3 F (36.8 C)  SpO2: 96% 97%     General:   Lethargic, moderate acute distress, morbidly obese Head: normocephalic, atraumatic Eyes: anicteric sclera ENT: oropharynx clear Neck: supple, nontender Lungs:  Clear throughout to auscultation.   No wheezes, crackles, or rhonchi. No acute distress. Heart:  Regular rate and rhythm; no murmurs, clicks, rubs,  or gallops. Abdomen: diffuse tenderness (worse in upper quadrants) with guarding, soft, nondistended, +BS  Rectal:  Deferred Ext: no edema  GI:  Lab Results: Recent Labs    01/26/24 0125  WBC 5.6  HGB 10.0*  HCT 34.8*  PLT 369   BMET Recent Labs    01/24/24 0819 01/26/24 0125  NA 137 137  K 3.7 3.6  CL 106 106  CO2 20* 21*  GLUCOSE 93 131*  BUN 6 8  CREATININE 0.77 0.68  CALCIUM 9.1 8.8*   LFT Recent Labs    01/26/24 0125  PROT 8.1  ALBUMIN 3.6  AST 136*  ALT 174*  ALKPHOS 271*  BILITOT 1.0   PT/INR No results for input(s): LABPROT, INR in the last 72  hours.   Studies/Results:  Impression/Plan: Abdominal pain and transaminitis in the setting of recent lap chole. Neg IOC and MRCP X 2. HIDA scan pending to evaluate for bile leak. Prelim negative for bile leak and no contrast in duodenum but needs delayed images to further evaluate for CBD obstruction. I am not convinced she has a CBD stone. If HIDA scan negative for bile leak, then see if LFTs rise further tomorrow and if so will need ERCP. If LFTs better then will see if EUS can be done this week by one of other groups since Dr. Burnette not available this week. Supportive care. Will follow.  LOS: 0 days   Jerrell JAYSON Sol  01/26/2024, 12:39 PM  Questions please call [phone]

## 2024-01-26 NOTE — ED Notes (Signed)
Patient returned from HIDA scan.

## 2024-01-26 NOTE — Plan of Care (Signed)

## 2024-01-26 NOTE — ED Notes (Signed)
 Patient transported to CT

## 2024-01-26 NOTE — ED Provider Notes (Signed)
  Physical Exam  BP 129/63 (BP Location: Right Wrist)   Pulse 60   Temp 98.3 F (36.8 C) (Oral)   Resp 14   Ht 5' 4 (1.626 m)   Wt (!) 158.8 kg   LMP 01/11/2024 Comment: negative urine preg test 01/26/23  SpO2 97%   BMI 60.09 kg/m   Physical Exam  Procedures  Procedures  ED Course / MDM    Medical Decision Making Amount and/or Complexity of Data Reviewed Labs: ordered. Radiology: ordered.  Risk OTC drugs. Prescription drug management.   LILLETTE Fairy Gravely, have assumed care for this patient.  In brief this is a 23 year old female signed out pending general surgery evaluation.  Surgery has come down to evaluate the patient.  GI has evaluated the patient.  With negative HIDA scan, patient may require ERCP.  Patient still requiring pain medication.  Will admit to hospitalist.       Gravely Fairy T, DO 01/26/24 1318

## 2024-01-26 NOTE — ED Provider Notes (Signed)
 [place] EMERGENCY DEPARTMENT AT [facility] Provider Note  CSN: [id number] Arrival date & time: 01/26/24 9887  Chief Complaint(s) Abdominal Pain  HPI Caitlin Anthony is a 23 y.o. female with a past medical history listed below including recent laparoscopic cholecystitis on June 13 here for recurrent right upper quadrant abdominal pain.  This began 2 hours ago shortly after eating ground [place].  Patient reports that she has been having recurrent right upper quadrant pain since the surgery.  She was actually admitted for the same and noted to have elevated LFTs with a negative MRCP.  She was discharged on 22 June.  Reports that the pain does not always come with food.  Endorses nausea without emesis.  No diarrhea.  No urinary symptoms.  The history is provided by the patient.    Past Medical History Past Medical History:  Diagnosis Date   Asthma    Crohn's disease [facility])    Patient Active Problem List   Diagnosis Date Noted   Hyperbilirubinemia 01/21/2024   Home Medication(s) Prior to Admission medications   Medication Sig Start Date End Date Taking? Authorizing Provider  acetaminophen  (TYLENOL ) 500 MG tablet Take 500 mg by mouth every 6 (six) hours as needed for moderate pain (pain score 4-6).   Yes [provider]  albuterol  (VENTOLIN  HFA) 108 (90 Base) MCG/ACT inhaler Inhale 2 puffs into the lungs every 4 (four) hours as needed for wheezing or shortness of breath (cough). 09/01/23  Yes Roselyn Carlin NOVAK, MD  budesonide-formoterol [facility]) 160-4.5 MCG/ACT inhaler Inhale 2 puffs into the lungs 2 (two) times daily as needed. 09/15/23 09/14/24 Yes [provider]  dicyclomine  (BENTYL ) 20 MG tablet Take 1 tablet (20 mg total) by mouth 2 (two) times daily. Patient taking differently: Take 20 mg by mouth 2 (two) times daily as needed for spasms. 01/16/24  Yes Palumbo, April, MD  methocarbamol  (ROBAXIN ) 500 MG tablet Take 1 tablet (500 mg total) by mouth 2 (two)  times daily. 01/16/24  Yes Palumbo, April, MD  metoCLOPramide (REGLAN) 10 MG tablet Take 10 mg by mouth every 6 (six) hours as needed for vomiting or nausea. 01/18/24 01/28/24 Yes [provider]  ondansetron  (ZOFRAN -ODT) 8 MG disintegrating tablet 8mg  ODT q8 hours prn nausea 01/16/24  Yes Palumbo, April, MD  oxyCODONE  (OXY IR/ROXICODONE ) 5 MG immediate release tablet Take 1 tablet (5 mg total) by mouth every 6 (six) hours as needed for severe pain (pain score 7-10). 01/15/24  Yes Kinsinger, Herlene Righter, MD  acetaminophen  (TYLENOL ) 325 MG tablet Take 2 tablets (650 mg total) by mouth every 6 (six) hours as needed for up to 30 doses. Patient not taking: Reported on 01/26/2024 01/31/22   Cottie Donnice PARAS, MD  ibuprofen  (ADVIL ) 800 MG tablet Take 1 tablet (800 mg total) by mouth every 8 (eight) hours as needed. 01/15/24   Kinsinger, Herlene Righter, MD  LOMAIRA 8 MG TABS Take 1 tablet by mouth. Patient not taking: Reported on 01/21/2024 10/25/23   [provider]  Allergies Haloperidol  decanoate, Hydrocodone, and Pineapple  Review of Systems Review of Systems As noted in HPI  Physical Exam Vital Signs  I have reviewed the triage vital signs BP 124/70   Pulse 71   Temp 97.9 F (36.6 C)   Resp 20   Ht 5' 4 (1.626 m)   Wt (!) 158.8 kg   LMP 01/11/2024 Comment: negative urine preg test 01/26/23  SpO2 99%   BMI 60.09 kg/m   Physical Exam Vitals reviewed.  Constitutional:      General: She is not in acute distress.    Appearance: She is well-developed. She is obese. She is not diaphoretic.  HENT:     Head: Normocephalic and atraumatic.     Right Ear: External ear normal.     Left Ear: External ear normal.     Nose: Nose normal.   Eyes:     General: No scleral icterus.    Conjunctiva/sclera: Conjunctivae normal.   Neck:     Trachea: Phonation  normal.   Cardiovascular:     Rate and Rhythm: Normal rate and regular rhythm.  Pulmonary:     Effort: Pulmonary effort is normal. No respiratory distress.     Breath sounds: No stridor.  Abdominal:     General: There is no distension.     Tenderness: There is abdominal tenderness in the right upper quadrant. There is right CVA tenderness. There is no left CVA tenderness.   Musculoskeletal:        General: Normal range of motion.     Cervical back: Normal range of motion.   Neurological:     Mental Status: She is alert and oriented to person, place, and time.   Psychiatric:        Behavior: Behavior normal.     ED Results and Treatments Labs (all labs ordered are listed, but only abnormal results are displayed) Labs Reviewed  COMPREHENSIVE METABOLIC PANEL WITH GFR - Abnormal; Notable for the following components:      Result Value   CO2 21 (*)    Glucose, Bld 131 (*)    Calcium 8.8 (*)    AST 136 (*)    ALT 174 (*)    Alkaline Phosphatase 271 (*)    All other components within normal limits  CBC - Abnormal; Notable for the following components:   Hemoglobin 10.0 (*)    HCT 34.8 (*)    MCV 74.8 (*)    MCH 21.5 (*)    MCHC 28.7 (*)    RDW 17.4 (*)    All other components within normal limits  URINALYSIS, ROUTINE W REFLEX MICROSCOPIC - Abnormal; Notable for the following components:   Color, Urine AMBER (*)    APPearance HAZY (*)    Specific Gravity, Urine 1.033 (*)    Hgb urine dipstick LARGE (*)    Bilirubin Urine SMALL (*)    Protein, ur 100 (*)    All other components within normal limits  LIPASE, BLOOD  PREGNANCY, URINE  EKG  EKG Interpretation Date/Time:    Ventricular Rate:    PR Interval:    QRS Duration:    QT Interval:    QTC Calculation:   R Axis:      Text Interpretation:         Radiology CT ABDOMEN PELVIS W  CONTRAST Result Date: 01/26/2024 EXAM: CT ABDOMEN AND PELVIS WITH CONTRAST 01/26/2024 06:10:39 AM TECHNIQUE: CT of the abdomen and pelvis was performed with the administration of intravenous contrast. Multiplanar reformatted images are provided for review. Automated exposure control, iterative reconstruction, and/or weight based adjustment of the mA/kV was utilized to reduce the radiation dose to as low as reasonably achievable. COMPARISON: CT of the abdomen and pelvis with contrast 01/16/2024. MRI of the abdomen 01/21/2024. Liver ultrasound 01/22/2024. CLINICAL HISTORY: Abdominal pain, post-op. Abdominal pain x 2 days. Patient report worsening RUQ abdominal pain after eating [place] tonight. Patient report taking PRN pain medication without relief. Patient recently Discharge 2 days ago with same complain. Hx cholecystectomy 11 days ago, hx of Crohn's. FINDINGS: LOWER CHEST: Mild dependent atelectasis is present in both lung bases. LIVER: The fluid density collection in the gallbladder fossa has increased in size measuring up to 3 cm. No significant extrahepatic fluid collection is present. Mild intrahepatic biliary dilation is noted. GALLBLADDER AND BILE DUCTS: Cholecystectomy is again noted. The dilated common bile duct is slightly more prominent than on the prior studies, and now measuring up to 12 mm. No obstructing lesion is present. SPLEEN: No acute abnormality. PANCREAS: The pancreatic duct is normal. The pancreas normally enhances. No mass lesion or cyst is present. ADRENAL GLANDS: No acute abnormality. KIDNEYS, URETERS AND BLADDER: No stones in the kidneys or ureters. No hydronephrosis. No perinephric or periureteral stranding. Urinary bladder is unremarkable. GI AND BOWEL: Stomach demonstrates no acute abnormality. There is no bowel obstruction. No bowel wall thickening. PERITONEUM AND RETROPERITONEUM: No ascites. No free air. A fat-containing periumbilical hernia measures 17 mm, stable VASCULATURE: Aorta  is normal in caliber. LYMPH NODES: No lymphadenopathy. REPRODUCTIVE ORGANS: No acute abnormality. BONES AND SOFT TISSUES: No acute osseous abnormality. No focal soft tissue abnormality. IMPRESSION: 1. Mild intrahepatic biliary dilation and progressively dilated common bile duct, now measuring up to 12 mm, without obstructing lesion. This corresponds to the obstructive lab profile. 2. Increased size of fluid density collection in the gallbladder fossa, now measuring up to 3 cm. This most likely represents a postoperative seroma. Biliary leak is considered less likely without fluid elsewhere. Electronically signed by: Lonni Necessary MD 01/26/2024 06:34 AM EDT RP Workstation: [id number]    Medications Ordered in ED Medications  HYDROmorphone  (DILAUDID ) injection 1 mg (1 mg Intravenous Given 01/26/24 0647)  sodium chloride  0.9 % bolus 1,000 mL (1,000 mLs Intravenous New Bag/Given 01/26/24 0331)  prochlorperazine (COMPAZINE) injection 10 mg (10 mg Intravenous Given 01/26/24 0310)  alum & mag hydroxide-simeth (MAALOX/MYLANTA) 200-200-20 MG/5ML suspension 30 mL (30 mLs Oral Given 01/26/24 0309)    And  lidocaine  (XYLOCAINE ) 2 % viscous mouth solution 15 mL (15 mLs Oral Given 01/26/24 0310)  HYDROmorphone  (DILAUDID ) injection 1 mg (1 mg Intravenous Given 01/26/24 0343)  iohexol  (OMNIPAQUE ) 300 MG/ML solution 100 mL (100 mLs Intravenous Contrast Given 01/26/24 0553)   Procedures Procedures  (including critical care time) Medical Decision Making / ED Course   Medical Decision Making Amount and/or Complexity of Data Reviewed Labs: ordered. Decision-making details documented in ED Course. Radiology: ordered and independent interpretation performed. Decision-making details documented in ED Course.  Risk OTC  drugs. Prescription drug management. Parenteral controlled substances.    Right upper quadrant abdominal pain status post lap chole POD 11.  Recent admission for the same and discharged 2 days  ago.  Differential diagnosis considered.  Workup below.  CBC without leukocytosis.  Stable hemoglobin.  CMP without significant electrolyte derangements or renal sufficiency.  Mild hyperglycemia without DKA.  Persistently elevated LFTs.  No pancreatitis.  UA not concerning for infection but does have hematuria.  CT scan shows worsening biliary dilatation pericholecystic seroma.  Per radiology it does not appear that this is related to biliary leak.  I spoke with Dr. Leonor Dawn who will arrange evaluation for patient in the emergency department for likely admission.      Final Clinical Impression(s) / ED Diagnoses Final diagnoses:  Dilation of biliary tract  Transaminitis  RUQ pain    This chart was dictated using voice recognition software.  Despite best efforts to proofread,  errors can occur which can change the documentation meaning.    Trine Raynell Moder, MD 01/26/24 [phone]

## 2024-01-26 NOTE — ED Triage Notes (Signed)
 Patient BIB EMS from home c/o abdominal pain x 2 days. Patient report worsening RUQ abdominal pain after eating [place] tonight. Patient report taking PRN pain medication without relief. Patient recently Discharge 2 days ago with same complain. Hx cholecystectomy 11 days ago.

## 2024-01-27 DIAGNOSIS — K759 Inflammatory liver disease, unspecified: Secondary | ICD-10-CM | POA: Diagnosis not present

## 2024-01-27 LAB — COMPREHENSIVE METABOLIC PANEL WITH GFR
ALT: 290 U/L — ABNORMAL HIGH (ref 0–44)
AST: 326 U/L — ABNORMAL HIGH (ref 15–41)
Albumin: 3.2 g/dL — ABNORMAL LOW (ref 3.5–5.0)
Alkaline Phosphatase: 364 U/L — ABNORMAL HIGH (ref 38–126)
Anion gap: 9 (ref 5–15)
BUN: <5 mg/dL — ABNORMAL LOW (ref 6–20)
CO2: 23 mmol/L (ref 22–32)
Calcium: 8.9 mg/dL (ref 8.9–10.3)
Chloride: 102 mmol/L (ref 98–111)
Creatinine, Ser: 0.64 mg/dL (ref 0.44–1.00)
GFR, Estimated: >60 mL/min (ref >60)
Glucose, Bld: 103 mg/dL — ABNORMAL HIGH (ref 70–99)
Potassium: 3.4 mmol/L — ABNORMAL LOW (ref 3.5–5.1)
Sodium: 134 mmol/L — ABNORMAL LOW (ref 135–145)
Total Bilirubin: 2.1 mg/dL — ABNORMAL HIGH (ref 0.0–1.2)
Total Protein: 7.6 g/dL (ref 6.5–8.1)

## 2024-01-27 LAB — CBC
HCT: 34.8 % — ABNORMAL LOW (ref 36.0–46.0)
Hemoglobin: 10 g/dL — ABNORMAL LOW (ref 12.0–15.0)
MCH: 21.9 pg — ABNORMAL LOW (ref 26.0–34.0)
MCHC: 28.7 g/dL — ABNORMAL LOW (ref 30.0–36.0)
MCV: 76.3 fL — ABNORMAL LOW (ref 80.0–100.0)
Platelets: 333 10*3/uL (ref 150–400)
RBC: 4.56 MIL/uL (ref 3.87–5.11)
RDW: 17.5 % — ABNORMAL HIGH (ref 11.5–15.5)
WBC: 3.5 10*3/uL — ABNORMAL LOW (ref 4.0–10.5)
nRBC: 0 % (ref 0.0–0.2)

## 2024-01-27 MED ORDER — SODIUM CHLORIDE 0.9 % IV SOLN: INTRAVENOUS | Status: DC

## 2024-01-27 MED ORDER — PROCHLORPERAZINE EDISYLATE 10 MG/2ML IJ SOLN
5.0000 mg | INTRAMUSCULAR | Status: DC | PRN
  Administered 2024-01-27: 10 mg via INTRAVENOUS
  Filled 2024-01-27: qty 2

## 2024-01-27 MED ORDER — MELATONIN 3 MG PO TABS
3.0000 mg | ORAL_TABLET | Freq: Every day | ORAL | Status: DC
  Administered 2024-01-27 – 2024-01-28 (×2): 3 mg via ORAL
  Filled 2024-01-27 (×2): qty 1

## 2024-01-27 MED ORDER — SERTRALINE HCL 25 MG PO TABS
25.0000 mg | ORAL_TABLET | Freq: Every day | ORAL | Status: DC
  Administered 2024-01-27 – 2024-01-29 (×3): 25 mg via ORAL
  Filled 2024-01-27 (×3): qty 1

## 2024-01-27 MED ORDER — DIPHENHYDRAMINE HCL 50 MG/ML IJ SOLN: 12.5000 mg | Freq: Every evening | INTRAMUSCULAR | Status: DC | PRN

## 2024-01-27 MED ORDER — POTASSIUM CHLORIDE CRYS ER 20 MEQ PO TBCR
40.0000 meq | EXTENDED_RELEASE_TABLET | Freq: Once | ORAL | Status: AC
  Administered 2024-01-27: 40 meq via ORAL
  Filled 2024-01-27: qty 2

## 2024-01-27 MED ORDER — POTASSIUM CHLORIDE 10 MEQ/100ML IV SOLN
10.0000 meq | INTRAVENOUS | Status: DC
  Administered 2024-01-27: 10 meq via INTRAVENOUS
  Filled 2024-01-27: qty 100

## 2024-01-27 MED ORDER — SODIUM CHLORIDE 0.9 % IV SOLN: INTRAVENOUS | Status: AC

## 2024-01-27 NOTE — Progress Notes (Signed)
 PROGRESS NOTE  Lucylle Foulkes FMW:[id number] DOB: [birth year] DOA: 01/26/2024 PCP: Loreli Elyn SAILOR, MD   LOS: 1 day   Brief narrative:  Caitlin Anthony is a 23 y.o. female with past medical history significant for morbid obesity status post lap chole with intraoperative cholangiogram on 01/15/2024 was admitted to the hospital with recurrent and worsening abdominal pain.  She had returned to the hospital and was admitted by the surgical service 6/19 to 6/22, was followed by GI as well and during that hospitalization had MRCP without abnormality.  She felt better with improved pain and was tolerating a bland diet and was discharged home on 6/22.  States that she was doing well for a short period of time, but then had recurrent severe pain with nausea without vomiting.  Patient then presented to the ED.  In the ED patient was noted to have elevated AST and ALT at 136 and 174 respectively.  Bilirubin at 1.0.  Lipase 27.  Urinalysis with 6-10 white cells repeat CT scan of the abdomen done on admission showed  progressively dilated biliary duct, but HIDA scan is unremarkable.  General surgery and GI was again consulted and patient was admitted to the hospital  for further evaluation and treatment.    Assessment/Plan: Principal Problem:   Hepatitis  Abdominal pain with elevated LFT and progressive dilatation of the biliary duct  CT scan of the abdomen this admission showed CBD dilatation up to 12 mm without definite CBD stone.  HIDA scan done this admission without any evidence of bile leak but no excretion of radiotracer into the extrahepatic bile duct or common bile duct.  Recent admission with lap chole and intraoperative cholangiogram.   Previous MRCP recently showed mild CBD dilatation but no evidence of stricture.  Currently NPO,.  Procedure likely tomorrow so we will put the patient on soft diet as per GI.  Will follow GI recommendation.  Continue with IV fluids.  Still complains of intractable pain.  Abnormal  LFTs-some progressive dilatation of the biliary tract noted.  GI on board.  Follow recommendations.  Mild hypokalemia.  Potassium of 3.4 today.  .  Unable to tolerate IV so we will change to p.o.  Check levels in a.m.  Mild hyponatremia.  Sodium of 134.  Continue with IV hydration.  Check BMP in AM.  DVT prophylaxis:    Disposition: Home likely in 2 to 3 days  Status is: Inpatient Remains inpatient appropriate because: Pending clinical improvement, GI workup    Code Status:     Code Status: Full Code  Family Communication: None at bedside  Consultants: GI  Procedures: None yet  Anti-infectives:  None yet  Anti-infectives (From admission, onward)    None      Subjective: Today, patient was seen and examined at bedside.  Complains of severe pain over the epigastric and right upper quadrant region with nausea.  Has not had any bowel movement or vomiting  Objective: Vitals:   01/27/24 0523 01/27/24 0748  BP: (!) 143/94   Pulse: 82   Resp: 16   Temp: 98.2 F (36.8 C)   SpO2: 97% 98%    Intake/Output Summary (Last 24 hours) at 01/27/2024 1010 Last data filed at 01/27/2024 0730 Gross per 24 hour  Intake 1128.28 ml  Output --  Net 1128.28 ml   Filed Weights   01/26/24 0123  Weight: (!) 158.8 kg   Body mass index is 60.09 kg/m.   Physical Exam:  GENERAL: Patient is alert awake and  oriented.  Obese built, in mild distress, HENT: No scleral pallor or icterus. Pupils equally reactive to light. Oral mucosa is moist NECK: is supple, no gross swelling noted. CHEST:   Diminished breath sounds bilaterally. CVS: S1 and S2 heard, no murmur. Regular rate and rhythm.  ABDOMEN: Soft, tenderness over the epigastric right upper quadrant on palpation, bowel sounds are present. EXTREMITIES: No edema. CNS: Cranial nerves are intact. No focal motor deficits. SKIN: warm and dry without rashes.  Data Review: I have personally reviewed the following laboratory data and  studies,  CBC: Recent Labs  Lab 01/21/24 1427 01/22/24 0443 01/23/24 0540 01/26/24 0125 01/27/24 0434  WBC 4.1 3.9* 3.8* 5.6 3.5*  NEUTROABS 2.3  --   --   --   --   HGB 11.3* 11.2* 10.5* 10.0* 10.0*  HCT 38.8 39.1 37.0 34.8* 34.8*  MCV 74.5* 75.5* 75.8* 74.8* 76.3*  PLT 412* 384 356 369 333   Basic Metabolic Panel: Recent Labs  Lab 01/22/24 0443 01/23/24 0540 01/24/24 0819 01/26/24 0125 01/27/24 0434  NA 136 135 137 137 134*  K 3.9 3.6 3.7 3.6 3.4*  CL 102 101 106 106 102  CO2 21* 22 20* 21* 23  GLUCOSE 102* 93 93 131* 103*  BUN 7 5* 6 8 <5*  CREATININE 0.80 0.85 0.77 0.68 0.64  CALCIUM 9.2 9.0 9.1 8.8* 8.9   Liver Function Tests: Recent Labs  Lab 01/22/24 0443 01/23/24 0540 01/24/24 0819 01/26/24 0125 01/27/24 0434  AST 299* 152* 89* 136* 326*  ALT 435* 309* 222* 174* 290*  ALKPHOS 271* 238* 211* 271* 364*  BILITOT 3.5* 1.4* 0.9 1.0 2.1*  PROT 8.3* 7.8 8.2* 8.1 7.6  ALBUMIN 3.7 3.4* 3.6 3.6 3.2*   Recent Labs  Lab 01/21/24 1427 01/23/24 0540 01/26/24 0125  LIPASE 24 33 27   No results for input(s): AMMONIA in the last 168 hours. Cardiac Enzymes: No results for input(s): CKTOTAL, CKMB, CKMBINDEX, TROPONINI in the last 168 hours. BNP (last 3 results) No results for input(s): BNP in the last 8760 hours.  ProBNP (last 3 results) No results for input(s): PROBNP in the last 8760 hours.  CBG: No results for input(s): GLUCAP in the last 168 hours. No results found for this or any previous visit (from the past 240 hours).   Studies: NM HEPATOBILIARY LEAK (POST-SURGICAL) Result Date: 01/26/2024 CLINICAL DATA:  Postcholecystectomy 01/15/2023. Persistent abdominal pain. Multiple emergency department visits for RIGHT upper quadrant pain. Normal bilirubin EXAM: NUCLEAR MEDICINE HEPATOBILIARY IMAGING TECHNIQUE: Sequential images of the abdomen were obtained out to 60 minutes following intravenous administration of radiopharmaceutical.  RADIOPHARMACEUTICALS:  5.5 mCi Tc-70m  Choletec IV COMPARISON:  CT 01/26/2024, MRI abdomen 01/21/2024 FINDINGS: Prompt clearance radiotracer from blood pool and uniform uptake within liver. There is no excretion of radiotracer into the extrahepatic ducts at 1 hour imaging. At 2 hours imaging endpoint, still no radiotracer activity within the extrahepatic bile ducts or common bile duct. No evidence bile leak. IMPRESSION: 1. No evidence of bile leak (although evaluation limited with absent excretion of radiotracer) 2. No excretion of radiotracer into the extrahepatic bile duct or common bile duct. Findings would suggest common bile duct obstruction; however, the bilirubin is normal. Therefore, would consider hepatocellular dysfunction such as hepatitis or cholestasis limiting excretion of radiotracer. Electronically Signed   By: Jackquline Boxer M.D.   On: 01/26/2024 14:00   CT ABDOMEN PELVIS W CONTRAST Result Date: 01/26/2024 EXAM: CT ABDOMEN AND PELVIS WITH CONTRAST 01/26/2024 06:10:39 AM  TECHNIQUE: CT of the abdomen and pelvis was performed with the administration of intravenous contrast. Multiplanar reformatted images are provided for review. Automated exposure control, iterative reconstruction, and/or weight based adjustment of the mA/kV was utilized to reduce the radiation dose to as low as reasonably achievable. COMPARISON: CT of the abdomen and pelvis with contrast 01/16/2024. MRI of the abdomen 01/21/2024. Liver ultrasound 01/22/2024. CLINICAL HISTORY: Abdominal pain, post-op. Abdominal pain x 2 days. Patient report worsening RUQ abdominal pain after eating [place] tonight. Patient report taking PRN pain medication without relief. Patient recently Discharge 2 days ago with same complain. Hx cholecystectomy 11 days ago, hx of Crohn's. FINDINGS: LOWER CHEST: Mild dependent atelectasis is present in both lung bases. LIVER: The fluid density collection in the gallbladder fossa has increased in size measuring up  to 3 cm. No significant extrahepatic fluid collection is present. Mild intrahepatic biliary dilation is noted. GALLBLADDER AND BILE DUCTS: Cholecystectomy is again noted. The dilated common bile duct is slightly more prominent than on the prior studies, and now measuring up to 12 mm. No obstructing lesion is present. SPLEEN: No acute abnormality. PANCREAS: The pancreatic duct is normal. The pancreas normally enhances. No mass lesion or cyst is present. ADRENAL GLANDS: No acute abnormality. KIDNEYS, URETERS AND BLADDER: No stones in the kidneys or ureters. No hydronephrosis. No perinephric or periureteral stranding. Urinary bladder is unremarkable. GI AND BOWEL: Stomach demonstrates no acute abnormality. There is no bowel obstruction. No bowel wall thickening. PERITONEUM AND RETROPERITONEUM: No ascites. No free air. A fat-containing periumbilical hernia measures 17 mm, stable VASCULATURE: Aorta is normal in caliber. LYMPH NODES: No lymphadenopathy. REPRODUCTIVE ORGANS: No acute abnormality. BONES AND SOFT TISSUES: No acute osseous abnormality. No focal soft tissue abnormality. IMPRESSION: 1. Mild intrahepatic biliary dilation and progressively dilated common bile duct, now measuring up to 12 mm, without obstructing lesion. This corresponds to the obstructive lab profile. 2. Increased size of fluid density collection in the gallbladder fossa, now measuring up to 3 cm. This most likely represents a postoperative seroma. Biliary leak is considered less likely without fluid elsewhere. Electronically signed by: Lonni Necessary MD 01/26/2024 06:34 AM EDT RP Workstation: [id number]      Vernal Alstrom, MD  [facility] 01/27/2024  If 7PM-7AM, please contact night-coverage

## 2024-01-27 NOTE — Progress Notes (Signed)
 [facility] Gastroenterology Progress Note  Kerria Sapien 23 y.o. 07-25-2001   Subjective: Abdominal pain better.   Objective: Vital signs: Vitals:   01/27/24 0748 01/27/24 1210  BP:  111/64  Pulse:  60  Resp:  16  Temp:  98 F (36.7 C)  SpO2: 98% 100%    Physical Exam: Gen: lethargic, obese, no acute distress  HEENT: anicteric sclera CV: RRR Chest: CTA B Abd: RUQ tenderness with minimal guarding, soft, nondistended, +BS Ext: no edema  Lab Results: Recent Labs    01/26/24 0125 01/27/24 0434  NA 137 134*  K 3.6 3.4*  CL 106 102  CO2 21* 23  GLUCOSE 131* 103*  BUN 8 <5*  CREATININE 0.68 0.64  CALCIUM 8.8* 8.9   Recent Labs    01/26/24 0125 01/27/24 0434  AST 136* 326*  ALT 174* 290*  ALKPHOS 271* 364*  BILITOT 1.0 2.1*  PROT 8.1 7.6  ALBUMIN 3.6 3.2*   Recent Labs    01/26/24 0125 01/27/24 0434  WBC 5.6 3.5*  HGB 10.0* 10.0*  HCT 34.8* 34.8*  MCV 74.8* 76.3*  PLT 369 333      Assessment/Plan: Transaminitis with HIDA scan showing possible biliary obstruction in need of ERCP due to rising LFTs. ERCP risks/benefits discussed with patient and she agrees to proceed. ERCP tomorrow with Dr. Rosalie at noon. NPO p MN. Supportive care.   Jerrell JAYSON Sol 01/27/2024, 2:40 PM  Questions please call [phone] ID: Ivery Molt, female   DOB: 2001/02/06, 23 y.o.   MRN: [id number]

## 2024-01-27 NOTE — Plan of Care (Signed)

## 2024-01-27 NOTE — TOC CM/SW Note (Signed)
 Transition of Care [facility]) - Inpatient Brief Assessment   Patient Details  Name: Caitlin Anthony MRN: [id number] Date of Birth: 2001/05/18  [facility]) CM/SW Contact:    Doneta Glenys DASEN, RN Phone Number: 01/27/2024, 3:07 PM   Clinical Narrative:  No TOC needs identified during chart review. Patient resting with eyes closed when CM entered room.   Transition of Care Asessment: Insurance and Status: Insurance coverage has been reviewed Patient has primary care physician: Yes Home environment has been reviewed: Apartment Prior level of function:: indeoendent Prior/Current Home Services: No current home services Social Drivers of Health Review: SDOH reviewed no interventions necessary Readmission risk has been reviewed: Yes Transition of care needs: no transition of care needs at this time

## 2024-01-27 NOTE — Progress Notes (Signed)
 H&P Note     Subjective: Feels bad this morning - reports epigastric pain and back pain. Just had an episode of emesis after sitting up. Her fiance is at bedside.   Objective: Vital signs in last 24 hours: Temp:  [98 F (36.7 C)-98.4 F (36.9 C)] 98 F (36.7 C) (06/25 1210) Pulse Rate:  [60-94] 60 (06/25 1210) Resp:  [16] 16 (06/25 1210) BP: (111-168)/(64-103) 111/64 (06/25 1210) SpO2:  [95 %-100 %] 100 % (06/25 1210) Last BM Date : 01/23/24  Intake/Output from previous day: 06/24 0701 - 06/25 0700 In: 1888.3 [P.O.:480; IV Piggyback:1408.3] Out: -  Intake/Output this shift: Total I/O In: 240 [P.O.:240] Out: -   PE: General: pleasant, WD, morbidly obese female who is uncomfortable but non-toxic appearing HEENT: Sclera are anicteric.  Abd: soft, TTP in epigastrium and RUQ, no peritonitis, ND, incisions appear C/D/I  Lab Results:  Recent Labs    01/26/24 0125 01/27/24 0434  WBC 5.6 3.5*  HGB 10.0* 10.0*  HCT 34.8* 34.8*  PLT 369 333   BMET Recent Labs    01/26/24 0125 01/27/24 0434  NA 137 134*  K 3.6 3.4*  CL 106 102  CO2 21* 23  GLUCOSE 131* 103*  BUN 8 <5*  CREATININE 0.68 0.64  CALCIUM 8.8* 8.9   PT/INR No results for input(s): LABPROT, INR in the last 72 hours. CMP     Component Value Date/Time   NA 134 (L) 01/27/2024 0434   K 3.4 (L) 01/27/2024 0434   CL 102 01/27/2024 0434   CO2 23 01/27/2024 0434   GLUCOSE 103 (H) 01/27/2024 0434   BUN <5 (L) 01/27/2024 0434   CREATININE 0.64 01/27/2024 0434   CALCIUM 8.9 01/27/2024 0434   PROT 7.6 01/27/2024 0434   ALBUMIN 3.2 (L) 01/27/2024 0434   AST 326 (H) 01/27/2024 0434   ALT 290 (H) 01/27/2024 0434   ALKPHOS 364 (H) 01/27/2024 0434   BILITOT 2.1 (H) 01/27/2024 0434   GFRNONAA >60 01/27/2024 0434   GFRAA >60 10/25/2019 2255   Lipase     Component Value Date/Time   LIPASE 27 01/26/2024 0125       Studies/Results: NM HEPATOBILIARY LEAK (POST-SURGICAL) Result Date:  01/26/2024 CLINICAL DATA:  Postcholecystectomy 01/15/2023. Persistent abdominal pain. Multiple emergency department visits for RIGHT upper quadrant pain. Normal bilirubin EXAM: NUCLEAR MEDICINE HEPATOBILIARY IMAGING TECHNIQUE: Sequential images of the abdomen were obtained out to 60 minutes following intravenous administration of radiopharmaceutical. RADIOPHARMACEUTICALS:  5.5 mCi Tc-2m  Choletec IV COMPARISON:  CT 01/26/2024, MRI abdomen 01/21/2024 FINDINGS: Prompt clearance radiotracer from blood pool and uniform uptake within liver. There is no excretion of radiotracer into the extrahepatic ducts at 1 hour imaging. At 2 hours imaging endpoint, still no radiotracer activity within the extrahepatic bile ducts or common bile duct. No evidence bile leak. IMPRESSION: 1. No evidence of bile leak (although evaluation limited with absent excretion of radiotracer) 2. No excretion of radiotracer into the extrahepatic bile duct or common bile duct. Findings would suggest common bile duct obstruction; however, the bilirubin is normal. Therefore, would consider hepatocellular dysfunction such as hepatitis or cholestasis limiting excretion of radiotracer. Electronically Signed   By: Jackquline Boxer M.D.   On: 01/26/2024 14:00   CT ABDOMEN PELVIS W CONTRAST Result Date: 01/26/2024 EXAM: CT ABDOMEN AND PELVIS WITH CONTRAST 01/26/2024 06:10:39 AM TECHNIQUE: CT of the abdomen and pelvis was performed with the administration of intravenous contrast. Multiplanar reformatted images are provided for review. Automated exposure control, iterative  reconstruction, and/or weight based adjustment of the mA/kV was utilized to reduce the radiation dose to as low as reasonably achievable. COMPARISON: CT of the abdomen and pelvis with contrast 01/16/2024. MRI of the abdomen 01/21/2024. Liver ultrasound 01/22/2024. CLINICAL HISTORY: Abdominal pain, post-op. Abdominal pain x 2 days. Patient report worsening RUQ abdominal pain after eating  [place] tonight. Patient report taking PRN pain medication without relief. Patient recently Discharge 2 days ago with same complain. Hx cholecystectomy 11 days ago, hx of Crohn's. FINDINGS: LOWER CHEST: Mild dependent atelectasis is present in both lung bases. LIVER: The fluid density collection in the gallbladder fossa has increased in size measuring up to 3 cm. No significant extrahepatic fluid collection is present. Mild intrahepatic biliary dilation is noted. GALLBLADDER AND BILE DUCTS: Cholecystectomy is again noted. The dilated common bile duct is slightly more prominent than on the prior studies, and now measuring up to 12 mm. No obstructing lesion is present. SPLEEN: No acute abnormality. PANCREAS: The pancreatic duct is normal. The pancreas normally enhances. No mass lesion or cyst is present. ADRENAL GLANDS: No acute abnormality. KIDNEYS, URETERS AND BLADDER: No stones in the kidneys or ureters. No hydronephrosis. No perinephric or periureteral stranding. Urinary bladder is unremarkable. GI AND BOWEL: Stomach demonstrates no acute abnormality. There is no bowel obstruction. No bowel wall thickening. PERITONEUM AND RETROPERITONEUM: No ascites. No free air. A fat-containing periumbilical hernia measures 17 mm, stable VASCULATURE: Aorta is normal in caliber. LYMPH NODES: No lymphadenopathy. REPRODUCTIVE ORGANS: No acute abnormality. BONES AND SOFT TISSUES: No acute osseous abnormality. No focal soft tissue abnormality. IMPRESSION: 1. Mild intrahepatic biliary dilation and progressively dilated common bile duct, now measuring up to 12 mm, without obstructing lesion. This corresponds to the obstructive lab profile. 2. Increased size of fluid density collection in the gallbladder fossa, now measuring up to 3 cm. This most likely represents a postoperative seroma. Biliary leak is considered less likely without fluid elsewhere. Electronically signed by: Lonni Necessary MD 01/26/2024 06:34 AM EDT RP Workstation:  [id number]    Anti-infectives: Anti-infectives (From admission, onward)    None         Latest Ref Rng & Units 01/27/2024    4:34 AM 01/26/2024    1:25 AM 01/24/2024    8:19 AM  Hepatic Function  Total Protein 6.5 - 8.1 g/dL 7.6  8.1  8.2   Albumin 3.5 - 5.0 g/dL 3.2  3.6  3.6   AST 15 - 41 U/L 326  136  89   ALT 0 - 44 U/L 290  174  222   Alk Phosphatase 38 - 126 U/L 364  271  211   Total Bilirubin 0.0 - 1.2 mg/dL 2.1  1.0  0.9       Assessment/Plan Epigastric abdominal pain with transaminitis 3 cm RUQ fluid collection POD12 s/p laparoscopic cholecystectomy with IOC - afebrile, hemodynamically stable - CBC with hgb stable at 10, no leukocytosis - CT scan 6/24 shows interval increase in RUQ fluid collection to 3 cm, more prominent common bile duct and mild intrahepatic biliary dilation without obstructing lesion. Bowel appears normal. - HIDA negative for bile leak - LFts increased today as above, GI has been consulted and they are tentatively planning ERCP tomorrow 6/26 - PRN analgesics and antiemetics  Other PMH:  Crohn's Asthma Obesity  Anxiety    LOS: 1 day      Almarie GORMAN Pringle, [facility] 01/27/2024, 1:36 PM Please see Amion for pager number during day  hours 7:00am-4:30pm

## 2024-01-28 ENCOUNTER — Encounter (HOSPITAL_COMMUNITY): Payer: Self-pay | Admitting: Internal Medicine

## 2024-01-28 ENCOUNTER — Inpatient Hospital Stay (HOSPITAL_COMMUNITY)

## 2024-01-28 ENCOUNTER — Inpatient Hospital Stay (HOSPITAL_COMMUNITY): Admitting: Registered Nurse

## 2024-01-28 ENCOUNTER — Encounter (HOSPITAL_COMMUNITY)
Admission: EM | Disposition: A | Discharge status: Home / Self Care | Payer: Self-pay | Source: Home / Self Care | Attending: Internal Medicine

## 2024-01-28 DIAGNOSIS — K8050 Calculus of bile duct without cholangitis or cholecystitis without obstruction: Secondary | ICD-10-CM | POA: Diagnosis not present

## 2024-01-28 DIAGNOSIS — K759 Inflammatory liver disease, unspecified: Secondary | ICD-10-CM

## 2024-01-28 DIAGNOSIS — J449 Chronic obstructive pulmonary disease, unspecified: Secondary | ICD-10-CM | POA: Diagnosis not present

## 2024-01-28 HISTORY — PX: ERCP: SHX5425

## 2024-01-28 LAB — COMPREHENSIVE METABOLIC PANEL WITH GFR
ALT: 292 U/L — ABNORMAL HIGH (ref 0–44)
AST: 259 U/L — ABNORMAL HIGH (ref 15–41)
Albumin: 3.2 g/dL — ABNORMAL LOW (ref 3.5–5.0)
Alkaline Phosphatase: 363 U/L — ABNORMAL HIGH (ref 38–126)
Anion gap: 12 (ref 5–15)
BUN: <5 mg/dL — ABNORMAL LOW (ref 6–20)
CO2: 24 mmol/L (ref 22–32)
Calcium: 9.1 mg/dL (ref 8.9–10.3)
Chloride: 104 mmol/L (ref 98–111)
Creatinine, Ser: 0.67 mg/dL (ref 0.44–1.00)
GFR, Estimated: >60 mL/min (ref >60)
Glucose, Bld: 107 mg/dL — ABNORMAL HIGH (ref 70–99)
Potassium: 3.6 mmol/L (ref 3.5–5.1)
Sodium: 140 mmol/L (ref 135–145)
Total Bilirubin: 2 mg/dL — ABNORMAL HIGH (ref 0.0–1.2)
Total Protein: 7.6 g/dL (ref 6.5–8.1)

## 2024-01-28 LAB — CBC
HCT: 35.4 % — ABNORMAL LOW (ref 36.0–46.0)
Hemoglobin: 10.2 g/dL — ABNORMAL LOW (ref 12.0–15.0)
MCH: 21.8 pg — ABNORMAL LOW (ref 26.0–34.0)
MCHC: 28.8 g/dL — ABNORMAL LOW (ref 30.0–36.0)
MCV: 75.8 fL — ABNORMAL LOW (ref 80.0–100.0)
Platelets: 352 10*3/uL (ref 150–400)
RBC: 4.67 MIL/uL (ref 3.87–5.11)
RDW: 17.7 % — ABNORMAL HIGH (ref 11.5–15.5)
WBC: 4.1 10*3/uL (ref 4.0–10.5)
nRBC: 0 % (ref 0.0–0.2)

## 2024-01-28 LAB — MAGNESIUM: Magnesium: 2.1 mg/dL (ref 1.7–2.4)

## 2024-01-28 SURGERY — ERCP, WITH INTERVENTION IF INDICATED
Anesthesia: General

## 2024-01-28 MED ORDER — LIDOCAINE 2% (20 MG/ML) 5 ML SYRINGE
INTRAMUSCULAR | Status: DC | PRN
  Administered 2024-01-28: 20 mg via INTRAVENOUS

## 2024-01-28 MED ORDER — FENTANYL CITRATE (PF) 100 MCG/2ML IJ SOLN
INTRAMUSCULAR | Status: DC | PRN
  Administered 2024-01-28: 50 ug via INTRAVENOUS

## 2024-01-28 MED ORDER — SUGAMMADEX SODIUM 200 MG/2ML IV SOLN
INTRAVENOUS | Status: DC | PRN
  Administered 2024-01-28: 300 mg via INTRAVENOUS

## 2024-01-28 MED ORDER — PROPOFOL 10 MG/ML IV BOLUS
INTRAVENOUS | Status: AC
  Filled 2024-01-28: qty 20

## 2024-01-28 MED ORDER — GLUCAGON HCL RDNA (DIAGNOSTIC) 1 MG IJ SOLR
INTRAMUSCULAR | Status: AC
Start: 2024-01-28 — End: 2024-01-28
  Filled 2024-01-28: qty 1

## 2024-01-28 MED ORDER — ACETAMINOPHEN 325 MG PO TABS
650.0000 mg | ORAL_TABLET | Freq: Once | ORAL | Status: AC
  Administered 2024-01-28: 650 mg via ORAL
  Filled 2024-01-28: qty 2

## 2024-01-28 MED ORDER — PHENYLEPHRINE 80 MCG/ML (10ML) SYRINGE FOR IV PUSH (FOR BLOOD PRESSURE SUPPORT)
PREFILLED_SYRINGE | INTRAVENOUS | Status: DC | PRN
  Administered 2024-01-28 (×2): 80 ug via INTRAVENOUS

## 2024-01-28 MED ORDER — ONDANSETRON HCL 4 MG/2ML IJ SOLN
INTRAMUSCULAR | Status: DC | PRN
  Administered 2024-01-28: 4 mg via INTRAVENOUS

## 2024-01-28 MED ORDER — CIPROFLOXACIN IN D5W 400 MG/200ML IV SOLN
400.0000 mg | Freq: Two times a day (BID) | INTRAVENOUS | Status: DC
  Administered 2024-01-28 – 2024-01-29 (×3): 400 mg via INTRAVENOUS
  Filled 2024-01-28 (×2): qty 200

## 2024-01-28 MED ORDER — LACTATED RINGERS IV SOLN
INTRAVENOUS | Status: AC | PRN
  Administered 2024-01-28: 1000 mL via INTRAVENOUS

## 2024-01-28 MED ORDER — MIDAZOLAM HCL 2 MG/2ML IJ SOLN
INTRAMUSCULAR | Status: AC
Start: 2024-01-28 — End: 2024-01-28
  Filled 2024-01-28: qty 2

## 2024-01-28 MED ORDER — ROCURONIUM BROMIDE 10 MG/ML (PF) SYRINGE
PREFILLED_SYRINGE | INTRAVENOUS | Status: DC | PRN
  Administered 2024-01-28: 40 mg via INTRAVENOUS

## 2024-01-28 MED ORDER — DEXAMETHASONE SODIUM PHOSPHATE 10 MG/ML IJ SOLN
INTRAMUSCULAR | Status: DC | PRN
  Administered 2024-01-28: 4 mg via INTRAVENOUS

## 2024-01-28 MED ORDER — EPHEDRINE SULFATE-NACL 50-0.9 MG/10ML-% IV SOSY
PREFILLED_SYRINGE | INTRAVENOUS | Status: DC | PRN
  Administered 2024-01-28 (×2): 5 mg via INTRAVENOUS

## 2024-01-28 MED ORDER — DICLOFENAC SUPPOSITORY 100 MG
RECTAL | Status: DC | PRN
  Administered 2024-01-28: 100 mg via RECTAL

## 2024-01-28 MED ORDER — PROPOFOL 10 MG/ML IV BOLUS
INTRAVENOUS | Status: DC | PRN
  Administered 2024-01-28: 300 mg via INTRAVENOUS

## 2024-01-28 MED ORDER — SODIUM CHLORIDE 0.9 % IV SOLN
INTRAVENOUS | Status: DC | PRN
  Administered 2024-01-28: 35 mL

## 2024-01-28 MED ORDER — DICLOFENAC SUPPOSITORY 100 MG
RECTAL | Status: AC
  Filled 2024-01-28: qty 1

## 2024-01-28 MED ORDER — FENTANYL CITRATE (PF) 100 MCG/2ML IJ SOLN
INTRAMUSCULAR | Status: AC
  Filled 2024-01-28: qty 2

## 2024-01-28 MED ORDER — CIPROFLOXACIN IN D5W 400 MG/200ML IV SOLN
INTRAVENOUS | Status: AC
Start: 2024-01-28 — End: 2024-01-28
  Filled 2024-01-28: qty 200

## 2024-01-28 MED ORDER — MIDAZOLAM HCL 5 MG/5ML IJ SOLN
INTRAMUSCULAR | Status: DC | PRN
Start: 2024-01-28 — End: 2024-01-28
  Administered 2024-01-28: 2 mg via INTRAVENOUS

## 2024-01-28 MED ORDER — SUCCINYLCHOLINE CHLORIDE 200 MG/10ML IV SOSY
PREFILLED_SYRINGE | INTRAVENOUS | Status: DC | PRN
  Administered 2024-01-28: 200 mg via INTRAVENOUS

## 2024-01-28 NOTE — Plan of Care (Signed)

## 2024-01-28 NOTE — Progress Notes (Signed)
 Caitlin Anthony 12:20 PM  Subjective: Patient familiar to me from weekend care and her hospital computer chart reviewed and her case discussed with my partner Dr. Dianna and she had recurrence of her pain when she went home and we rediscussed the current procedure and she has no new complaints  Objective: Vital signs stable afebrile no acute distress exam please see preassessment evaluation LFTs about the same greater than they were 4 days ago CBC okay hide and repeat CT reviewed as well as previous MRI and IntraOp cholangiogram  Assessment: Concerns over CBD stones  Plan: Okay to proceed with ERCP with anesthesia assistance and that procedure was rediscussed with the patient including the risks and success rate  [facility]  office [phone] After 5PM or if no answer call [phone]

## 2024-01-28 NOTE — Anesthesia Procedure Notes (Signed)
 Procedure Name: Intubation Date/Time: 01/28/2024 12:35 PM  Performed by: Memory Armida LABOR, CRNAPre-anesthesia Checklist: Patient identified, Emergency Drugs available, Suction available, Patient being monitored and Timeout performed Patient Re-evaluated:Patient Re-evaluated prior to induction Oxygen Delivery Method: Circle system utilized Preoxygenation: Pre-oxygenation with 100% oxygen Induction Type: IV induction, Rapid sequence and Cricoid Pressure applied Laryngoscope Size: 3 Grade View: Grade I Tube type: Parker flex tip Tube size: 7.5 mm Number of attempts: 1 Airway Equipment and Method: Rigid stylet and Video-laryngoscopy Placement Confirmation: ETT inserted through vocal cords under direct vision, positive ETCO2 and breath sounds checked- equal and bilateral Secured at: 22 cm Tube secured with: Tape Dental Injury: Teeth and Oropharynx as per pre-operative assessment  Comments: Elective Glidescope intubation. RSI due to pt's nausea. Smooth IV induction. Cricoid pressure by Dr Leonce BUTCHER. BBS=. ATOI

## 2024-01-28 NOTE — Anesthesia Preprocedure Evaluation (Signed)
 Anesthesia Evaluation  Patient identified by MRN, date of birth, ID band Patient awake    Reviewed: Allergy & Precautions, NPO status , Patient's Chart, lab work & pertinent test results  History of Anesthesia Complications Negative for: history of anesthetic complications  Airway Mallampati: II  TM Distance: >3 FB Neck ROM: Full    Dental  (+) Dental Advisory Given   Pulmonary asthma , COPD,  COPD inhaler   breath sounds clear to auscultation       Cardiovascular negative cardio ROS  Rhythm:Regular Rate:Normal     Neuro/Psych negative neurological ROS     GI/Hepatic ,GERD  Medicated,,Elevated LFTs N/V with cholelithiasis   Endo/Other  BMI 60  Renal/GU negative Renal ROS     Musculoskeletal   Abdominal   Peds  Hematology  (+) Blood dyscrasia, anemia Hb 10.2, plt 352   Anesthesia Other Findings   Reproductive/Obstetrics                             Anesthesia Physical Anesthesia Plan  ASA: 3  Anesthesia Plan: General   Post-op Pain Management: Minimal or no pain anticipated   Induction: Intravenous  PONV Risk Score and Plan: 3 and Ondansetron , Dexamethasone  and Midazolam   Airway Management Planned: Oral ETT and Video Laryngoscope Planned  Additional Equipment: None  Intra-op Plan:   Post-operative Plan: Extubation in OR  Informed Consent: I have reviewed the patients History and Physical, chart, labs and discussed the procedure including the risks, benefits and alternatives for the proposed anesthesia with the patient or authorized representative who has indicated his/her understanding and acceptance.     Dental advisory given  Plan Discussed with: CRNA and Surgeon  Anesthesia Plan Comments:        Anesthesia Quick Evaluation

## 2024-01-28 NOTE — Progress Notes (Signed)
 PROGRESS NOTE  Caitlin Anthony FMW:[id number] DOB: May 15, 2001 DOA: 01/26/2024 PCP: Loreli Elyn SAILOR, MD   LOS: 2 days   Brief narrative:  Caitlin Anthony is a 23 y.o. female with past medical history significant for asthma, morbid obesity status post lap chole with intraoperative cholangiogram on 01/15/2024 was admitted to the hospital with recurrent and worsening abdominal pain.  She had returned to the hospital and was admitted by the surgical service 6/19 to 6/22, was followed by GI as well and during that hospitalization had MRCP without abnormality.  She felt better with improved pain and was tolerating a bland diet and was discharged home on 6/22.  States that she was doing well for a short period of time, but then had recurrent severe pain with nausea without vomiting.  Patient then presented to the ED.  In the ED patient was noted to have elevated AST and ALT at 136 and 174 respectively.  Bilirubin at 1.0.  Lipase 27.  Urinalysis with 6-10 white cells repeat CT scan of the abdomen done on admission showed  progressively dilated biliary duct, but HIDA scan is unremarkable.  General surgery and GI was again consulted and patient was admitted to the hospital  for further evaluation and treatment.    Assessment/Plan: Principal Problem:   Hepatitis  Persistent abdominal pain with elevated LFT and progressive dilatation of the biliary duct  CT scan of the abdomen this admission showed CBD dilatation up to 12 mm without definite CBD stone.  HIDA scan done this admission without any evidence of bile leak but no excretion of radiotracer into the extrahepatic bile duct or common bile duct.  Recent admission with lap chole and intraoperative cholangiogram.   Previous MRCP recently showed mild CBD dilatation but no evidence of stricture.  Currently NPO for the procedure.  Plan for ERCP today.Will follow GI recommendation.  Continue with IV fluids.  Still complains of intractable pain.  Abnormal LFTs-some progressive  dilatation of the biliary tract noted.  GI on board.  Follow recommendations.  Plan for ERCP today.  History of asthma.  Continue inhalers.  Mild hypokalemia.  Improved after replacement.  Latest potassium 3.6.  Mild hyponatremia.  Improved.  Latest sodium of 140.  Class III obesity.  Body mass index is 60.09 kg/m. Patient would benefit from weight loss as outpatient.  DVT prophylaxis: Lovenox  subcu   Disposition: Home likely in 1 to 2 days  Status is: Inpatient Remains inpatient appropriate because: Pending clinical improvement, GI workup, plan for ERCP,    Code Status:     Code Status: Full Code  Family Communication: None at bedside  Consultants: GI  Procedures: None yet  Anti-infectives:  None yet  Anti-infectives (From admission, onward)    None      Subjective: Today, patient was seen and examined at bedside.  Still complains of persistent abdominal pain and does not feel much improved.  Denies any nausea vomiting fever chills or rigor.    Objective: Vitals:   01/28/24 0517 01/28/24 0858  BP: (!) 105/57   Pulse: 72   Resp: 16   Temp: 98.5 F (36.9 C)   SpO2: 97% 95%    Intake/Output Summary (Last 24 hours) at 01/28/2024 0941 Last data filed at 01/27/2024 1500 Gross per 24 hour  Intake 31.85 ml  Output --  Net 31.85 ml   Filed Weights   01/26/24 0123  Weight: (!) 158.8 kg   Body mass index is 60.09 kg/m.   Physical Exam:  GENERAL:  Patient is alert awake and oriented.  Obese built, in mild distress due to pain, HENT: No scleral pallor or icterus. Pupils equally reactive to light. Oral mucosa is moist NECK: is supple, no gross swelling noted. CHEST:   Diminished breath sounds bilaterally. CVS: S1 and S2 heard, no murmur. Regular rate and rhythm.  ABDOMEN: Soft, tenderness over the epigastric and right upper quadrant region on palpation bowel sounds are present. EXTREMITIES: No edema. CNS: Cranial nerves are intact. No focal motor  deficits. SKIN: warm and dry without rashes.  Data Review: I have personally reviewed the following laboratory data and studies,  CBC: Recent Labs  Lab 01/21/24 1427 01/22/24 0443 01/23/24 0540 01/26/24 0125 01/27/24 0434 01/28/24 0420  WBC 4.1 3.9* 3.8* 5.6 3.5* 4.1  NEUTROABS 2.3  --   --   --   --   --   HGB 11.3* 11.2* 10.5* 10.0* 10.0* 10.2*  HCT 38.8 39.1 37.0 34.8* 34.8* 35.4*  MCV 74.5* 75.5* 75.8* 74.8* 76.3* 75.8*  PLT 412* 384 356 369 333 352   Basic Metabolic Panel: Recent Labs  Lab 01/23/24 0540 01/24/24 0819 01/26/24 0125 01/27/24 0434 01/28/24 0420  NA 135 137 137 134* 140  K 3.6 3.7 3.6 3.4* 3.6  CL 101 106 106 102 104  CO2 22 20* 21* 23 24  GLUCOSE 93 93 131* 103* 107*  BUN 5* 6 8 <5* <5*  CREATININE 0.85 0.77 0.68 0.64 0.67  CALCIUM 9.0 9.1 8.8* 8.9 9.1  MG  --   --   --   --  2.1   Liver Function Tests: Recent Labs  Lab 01/23/24 0540 01/24/24 0819 01/26/24 0125 01/27/24 0434 01/28/24 0420  AST 152* 89* 136* 326* 259*  ALT 309* 222* 174* 290* 292*  ALKPHOS 238* 211* 271* 364* 363*  BILITOT 1.4* 0.9 1.0 2.1* 2.0*  PROT 7.8 8.2* 8.1 7.6 7.6  ALBUMIN 3.4* 3.6 3.6 3.2* 3.2*   Recent Labs  Lab 01/21/24 1427 01/23/24 0540 01/26/24 0125  LIPASE 24 33 27   No results for input(s): AMMONIA in the last 168 hours. Cardiac Enzymes: No results for input(s): CKTOTAL, CKMB, CKMBINDEX, TROPONINI in the last 168 hours. BNP (last 3 results) No results for input(s): BNP in the last 8760 hours.  ProBNP (last 3 results) No results for input(s): PROBNP in the last 8760 hours.  CBG: No results for input(s): GLUCAP in the last 168 hours. No results found for this or any previous visit (from the past 240 hours).   Studies: NM HEPATOBILIARY LEAK (POST-SURGICAL) Result Date: 01/26/2024 CLINICAL DATA:  Postcholecystectomy 01/15/2023. Persistent abdominal pain. Multiple emergency department visits for RIGHT upper quadrant pain. Normal  bilirubin EXAM: NUCLEAR MEDICINE HEPATOBILIARY IMAGING TECHNIQUE: Sequential images of the abdomen were obtained out to 60 minutes following intravenous administration of radiopharmaceutical. RADIOPHARMACEUTICALS:  5.5 mCi Tc-7m  Choletec IV COMPARISON:  CT 01/26/2024, MRI abdomen 01/21/2024 FINDINGS: Prompt clearance radiotracer from blood pool and uniform uptake within liver. There is no excretion of radiotracer into the extrahepatic ducts at 1 hour imaging. At 2 hours imaging endpoint, still no radiotracer activity within the extrahepatic bile ducts or common bile duct. No evidence bile leak. IMPRESSION: 1. No evidence of bile leak (although evaluation limited with absent excretion of radiotracer) 2. No excretion of radiotracer into the extrahepatic bile duct or common bile duct. Findings would suggest common bile duct obstruction; however, the bilirubin is normal. Therefore, would consider hepatocellular dysfunction such as hepatitis or cholestasis limiting excretion of  radiotracer. Electronically Signed   By: Jackquline Boxer M.D.   On: 01/26/2024 14:00      Vernal Alstrom, MD  [facility] 01/28/2024  If 7PM-7AM, please contact night-coverage

## 2024-01-28 NOTE — Progress Notes (Signed)
 Pt is tolerating clear liquid and was able to void on bedpan.

## 2024-01-28 NOTE — Anesthesia Postprocedure Evaluation (Signed)
 Anesthesia Post Note  Patient: Caitlin Anthony  Procedure(s) Performed: ERCP, WITH INTERVENTION IF INDICATED     Patient location during evaluation: Endoscopy Anesthesia Type: General Level of consciousness: awake and alert, oriented and patient cooperative Pain management: pain level controlled Vital Signs Assessment: post-procedure vital signs reviewed and stable Respiratory status: spontaneous breathing, nonlabored ventilation and respiratory function stable Cardiovascular status: blood pressure returned to baseline and stable Postop Assessment: no apparent nausea or vomiting, able to ambulate and adequate PO intake Anesthetic complications: no  No notable events documented.  Last Vitals:  Vitals:   01/28/24 1400 01/28/24 1410  BP: (!) 153/86 (!) 154/83  Pulse: 92 71  Resp: 16 12  Temp:    SpO2: 97% 97%    Last Pain:  Vitals:   01/28/24 1410  TempSrc:   PainSc: 0-No pain                 Keviana Guida,E. Masami Plata

## 2024-01-28 NOTE — Transfer of Care (Signed)
 Immediate Anesthesia Transfer of Care Note  Patient: Caitlin Anthony  Procedure(s) Performed: ERCP, WITH INTERVENTION IF INDICATED  Patient Location: PACU  Anesthesia Type:General  Level of Consciousness: drowsy and patient cooperative  Airway & Oxygen Therapy: Patient Spontanous Breathing and Patient connected to face mask oxygen  Post-op Assessment: Report given to RN, Post -op Vital signs reviewed and stable, and Patient moving all extremities  Post vital signs: Reviewed and stable  Last Vitals:  Vitals Value Taken Time  BP 173/96 01/28/24 13:40  Temp    Pulse 88 01/28/24 13:42  Resp 0 01/28/24 13:42  SpO2 100 % 01/28/24 13:42  Vitals shown include unfiled device data.  Last Pain:  Vitals:   01/28/24 1110  TempSrc: Tympanic  PainSc: 8          Complications: No notable events documented.

## 2024-01-28 NOTE — Op Note (Signed)
 [facility] Patient Name: Caitlin Anthony Procedure Date: 01/28/2024 MRN: [id number] Attending MD: Oliva Boots , MD, [id number] Date of Birth: 2000/10/13 CSN: [id number] Age: 23 Admit Type: Inpatient Procedure:                ERCP Indications:              Abdominal pain of suspected biliary origin,                            Suspected bile duct stone(s), Elevated liver enzymes Providers:                Oliva Boots, MD, Hoy Penner, RN, Curtistine Bishop, Technician Referring MD:              Medicines:                General Anesthesia Complications:            No immediate complications. Estimated Blood Loss:     Estimated blood loss: none. Procedure:                Pre-Anesthesia Assessment:                           - Prior to the procedure, a History and Physical                            was performed, and patient medications and                            allergies were reviewed. The patient's tolerance of                            previous anesthesia was also reviewed. The risks                            and benefits of the procedure and the sedation                            options and risks were discussed with the patient.                            All questions were answered, and informed consent                            was obtained. Prior Anticoagulants: The patient has                            taken no anticoagulant or antiplatelet agents. ASA                            Grade Assessment: III - A patient with severe  systemic disease. After reviewing the risks and                            benefits, the patient was deemed in satisfactory                            condition to undergo the procedure.                           After obtaining informed consent, the scope was                            passed under direct vision. Throughout the                            procedure, the patient's  blood pressure, pulse, and                            oxygen saturations were monitored continuously. The                            [id number]-[id number] ([id number]) Olympus duodenoscope was                            introduced through the mouth, and used to inject                            contrast into and used to cannulate the bile duct.                            The ERCP was accomplished without difficulty. The                            patient tolerated the procedure well. Scope In: Scope Out: Findings:      The major papilla was slightly bulging. Deep selective cannulation was       readily obtained and we thought we saw a small stone on initial       injection and the system was slightly dilated and we proceeded with the       customary biliary sphincterotomy which was made with a Hydratome       sphincterotome using ERBE electrocautery. There was no       post-sphincterotomy bleeding. We proceeded until we had adequate biliary       drainage and could get the fully bowed sphincterotome easily in and out       of the duct and to discover objects, the biliary tree was swept with a       12 mm balloon starting at the bifurcation. All stones were removed on       the first pull-through which was 1 and multiple subsequent balloon       pull-through's did not reveal any further stones and a 12 mm balloon       passed multiple times readily through the patent sphincterotomy site.       However there was not much drainage at the end of the procedure so we  proceeded with Dilation of the common bile duct with a 4 cm by 8 mm       balloon dilator which was successful and biliary drainage was much       improved and the wire in the balloon catheter were removed and the scope       was removed and the patient tolerated the procedure well there was no       obvious immediate complication and there was no pancreatic duct       injection or wire advancement throughout the procedure. Impression:                - The major papilla appeared to be slightly bulging.                           - Choledocholithiasis was found. Complete removal                            was accomplished by biliary sphincterotomy and                            balloon extraction.                           - A biliary sphincterotomy was performed.                           - The biliary tree was swept and nothing was found                            at the end of the procedure.                           - Common bile duct was successfully dilated. Moderate Sedation:      Not Applicable - Patient had care per Anesthesia. Recommendation:           - Clear liquid diet for 6 hours. If doing well this                            evening or tomorrow may slowly advance diet                           - Continue present medications.                           - Return to GI clinic PRN.                           - Telephone GI clinic if symptomatic PRN.                           - Check liver enzymes (AST, ALT, alkaline                            phosphatase, bilirubin) in the morning. And follow  back to normal as an outpatient Procedure Code(s):        --- Professional ---                           [phone], 59, Endoscopic retrograde                            cholangiopancreatography (ERCP); with                            trans-endoscopic balloon dilation of                            biliary/pancreatic duct(s) or of ampulla                            (sphincteroplasty), including sphincterotomy, when                            performed, each duct                           [id number], Endoscopic retrograde                            cholangiopancreatography (ERCP); with removal of                            calculi/debris from biliary/pancreatic duct(s) Diagnosis Code(s):        --- Professional ---                           K80.50, Calculus of bile duct without cholangitis                             or cholecystitis without obstruction                           R10.9, Unspecified abdominal pain                           R74.8, Abnormal levels of other serum enzymes                           K83.8, Other specified diseases of biliary tract CPT copyright 2022 [employer]. All rights reserved. The codes documented in this report are preliminary and upon coder review may  be revised to meet current compliance requirements. Oliva Boots, MD 01/28/2024 1:43:21 PM This report has been signed electronically. Number of Addenda: 0

## 2024-01-29 LAB — CBC
HCT: 37 % (ref 36.0–46.0)
Hemoglobin: 10.5 g/dL — ABNORMAL LOW (ref 12.0–15.0)
MCH: 21.8 pg — ABNORMAL LOW (ref 26.0–34.0)
MCHC: 28.4 g/dL — ABNORMAL LOW (ref 30.0–36.0)
MCV: 76.8 fL — ABNORMAL LOW (ref 80.0–100.0)
Platelets: 379 10*3/uL (ref 150–400)
RBC: 4.82 MIL/uL (ref 3.87–5.11)
RDW: 17.8 % — ABNORMAL HIGH (ref 11.5–15.5)
WBC: 5.4 10*3/uL (ref 4.0–10.5)
nRBC: 0 % (ref 0.0–0.2)

## 2024-01-29 LAB — COMPREHENSIVE METABOLIC PANEL WITH GFR
ALT: 259 U/L — ABNORMAL HIGH (ref 0–44)
AST: 154 U/L — ABNORMAL HIGH (ref 15–41)
Albumin: 3.5 g/dL (ref 3.5–5.0)
Alkaline Phosphatase: 356 U/L — ABNORMAL HIGH (ref 38–126)
Anion gap: 10 (ref 5–15)
BUN: 6 mg/dL (ref 6–20)
CO2: 23 mmol/L (ref 22–32)
Calcium: 9.3 mg/dL (ref 8.9–10.3)
Chloride: 104 mmol/L (ref 98–111)
Creatinine, Ser: 0.69 mg/dL (ref 0.44–1.00)
GFR, Estimated: >60 mL/min (ref >60)
Glucose, Bld: 124 mg/dL — ABNORMAL HIGH (ref 70–99)
Potassium: 3.6 mmol/L (ref 3.5–5.1)
Sodium: 137 mmol/L (ref 135–145)
Total Bilirubin: 1.1 mg/dL (ref 0.0–1.2)
Total Protein: 8.1 g/dL (ref 6.5–8.1)

## 2024-01-29 LAB — MAGNESIUM: Magnesium: 2.1 mg/dL (ref 1.7–2.4)

## 2024-01-29 MED ORDER — PANTOPRAZOLE SODIUM 40 MG PO TBEC: 40.0000 mg | DELAYED_RELEASE_TABLET | Freq: Every day | ORAL | 0 refills | Status: AC

## 2024-01-29 NOTE — Progress Notes (Signed)
 H&P Note  1 Day Post-Op  Subjective: Reports feeling much better today. Denies nausea/vomiting. Tolerating PO.   Objective: Vital signs in last 24 hours: Temp:  [97.4 F (36.3 C)-99 F (37.2 C)] 98 F (36.7 C) (06/27 0404) Pulse Rate:  [59-95] 73 (06/27 0404) Resp:  [12-19] 18 (06/27 0404) BP: (130-191)/(69-102) 130/89 (06/27 0404) SpO2:  [97 %-100 %] 99 % (06/27 0404) Last BM Date : 01/23/24  Intake/Output from previous day: 06/26 0701 - 06/27 0700 In: 1910 [P.O.:480; I.V.:1030; IV Piggyback:400] Out: -  Intake/Output this shift: No intake/output data recorded.  PE: General: pleasant, WD, obese female in NAD HEENT: Sclera are anicteric.  Abd: soft, overall non-tender, incisions c/d/I   Lab Results:  Recent Labs    01/28/24 0420 01/29/24 0414  WBC 4.1 5.4  HGB 10.2* 10.5*  HCT 35.4* 37.0  PLT 352 379   BMET Recent Labs    01/28/24 0420 01/29/24 0414  NA 140 137  K 3.6 3.6  CL 104 104  CO2 24 23  GLUCOSE 107* 124*  BUN <5* 6  CREATININE 0.67 0.69  CALCIUM 9.1 9.3   PT/INR No results for input(s): LABPROT, INR in the last 72 hours. CMP     Component Value Date/Time   NA 137 01/29/2024 0414   K 3.6 01/29/2024 0414   CL 104 01/29/2024 0414   CO2 23 01/29/2024 0414   GLUCOSE 124 (H) 01/29/2024 0414   BUN 6 01/29/2024 0414   CREATININE 0.69 01/29/2024 0414   CALCIUM 9.3 01/29/2024 0414   PROT 8.1 01/29/2024 0414   ALBUMIN 3.5 01/29/2024 0414   AST 154 (H) 01/29/2024 0414   ALT 259 (H) 01/29/2024 0414   ALKPHOS 356 (H) 01/29/2024 0414   BILITOT 1.1 01/29/2024 0414   GFRNONAA >60 01/29/2024 0414   GFRAA >60 10/25/2019 2255   Lipase     Component Value Date/Time   LIPASE 27 01/26/2024 0125       Studies/Results: DG ERCP Result Date: 01/28/2024 CLINICAL DATA:  [id number] Surgery, elective [id number] EXAM: ERCP COMPARISON:  Intraoperative fluoroscopy, 01/15/2024. CT AP, 01/26/2024. FLUOROSCOPY: Radiation Exposure Index and estimated peak  skin dose (PSD); Reference air kerma (RAK), 73.4 mGy. FINDINGS: Limited oblique planar images of the RIGHT upper quadrant obtained C-arm. Images demonstrating flexible endoscopy, biliary duct cannulation, sphincterotomy, retrograde cholangiogram and balloon sweep. No biliary ductal dilation. No evidence of biliary filling defect is demonstrated. IMPRESSION: Fluoroscopic imaging for ERCP. For complete description of intra procedural findings, please see performing service dictation. Electronically Signed   By: Thom Hall M.D.   On: 01/28/2024 15:43    Anti-infectives: Anti-infectives (From admission, onward)    Start     Dose/Rate Route Frequency Ordered Stop   01/28/24 1130  ciprofloxacin  (CIPRO ) IVPB 400 mg        400 mg 200 mL/hr over 60 Minutes Intravenous Every 12 hours 01/28/24 1124           Latest Ref Rng & Units 01/29/2024    4:14 AM 01/28/2024    4:20 AM 01/27/2024    4:34 AM  Hepatic Function  Total Protein 6.5 - 8.1 g/dL 8.1  7.6  7.6   Albumin 3.5 - 5.0 g/dL 3.5  3.2  3.2   AST 15 - 41 U/L 154  259  326   ALT 0 - 44 U/L 259  292  290   Alk Phosphatase 38 - 126 U/L 356  363  364   Total Bilirubin 0.0 -  1.2 mg/dL 1.1  2.0  2.1       Assessment/Plan Readmitted to hospital with epigastric abdominal pain and transaminitis, 3 cm GB fossa fluid collection POD14 s/p laparoscopic cholecystectomy with IOC - afebrile, hemodynamically stable - CT scan 6/24 shows interval increase in RUQ fluid collection to 3 cm, more prominent common bile duct and mild intrahepatic biliary dilation without obstructing lesion. Bowel appears normal. - HIDA negative for bile leak  Choledocholithiasis S/p ERCP, sphincterotomy 6/26 Dr. Rosalie Patient clinically improving after stones were cleared yesterday  Stable for discharge from a [facility] standpoint. She is scheduled for follow up in our office 7/15  Other PMH:  Crohn's Asthma Obesity  Anxiety    LOS: 3 days      Caitlin Anthony, [facility] Surgery 01/29/2024, 10:13 AM Please see Amion for pager number during day hours 7:00am-4:30pm

## 2024-01-29 NOTE — Op Note (Addendum)
 [facility] Patient Name: Messina Kosinski Procedure Date: 01/28/2024 MRN: [id number] Attending MD: Oliva Boots , MD, [id number] Date of Birth: 2001-07-12 CSN:  Age: 23 Admit Type: Inpatient THIS EXAM WAS SENT IN ERROR

## 2024-01-29 NOTE — Progress Notes (Signed)
 [facility] Progress Note  Caitlin Anthony 23 y.o. [birth year]   Subjective: Feels good. Tolerating soft food without worsened abdominal pain, nausea, or vomiting.  Objective: Vital signs: Vitals:   01/28/24 1937 01/29/24 0404  BP: 131/69 130/89  Pulse: (!) 59 73  Resp: 18 18  Temp: 99 F (37.2 C) 98 F (36.7 C)  SpO2: 100% 99%    Physical Exam: Gen: alert, no acute distress, obese, pleasant  HEENT: anicteric sclera CV: RRR Chest: CTA B Abd: soft, nontender, nondistended, +BS Ext: no edema  Lab Results: Recent Labs    01/28/24 0420 01/29/24 0414  NA 140 137  K 3.6 3.6  CL 104 104  CO2 24 23  GLUCOSE 107* 124*  BUN <5* 6  CREATININE 0.67 0.69  CALCIUM 9.1 9.3  MG 2.1 2.1   Recent Labs    01/28/24 0420 01/29/24 0414  AST 259* 154*  ALT 292* 259*  ALKPHOS 363* 356*  BILITOT 2.0* 1.1  PROT 7.6 8.1  ALBUMIN 3.2* 3.5   Recent Labs    01/28/24 0420 01/29/24 0414  WBC 4.1 5.4  HGB 10.2* 10.5*  HCT 35.4* 37.0  MCV 75.8* 76.8*  PLT 352 379      Assessment/Plan: Choledocholithiasis - s/p ERCP yesterday. LFTs improving. Doing well post-ERCP and tolerating diet. Stable to go home today. Will have her come to [facility] for repeat LFTs next week.   Jerrell JAYSON Sol 01/29/2024, 11:41 AM  Questions please call [phone] ID: Caitlin Anthony, female   DOB: 2000-11-22, 23 y.o.   MRN: [id number]

## 2024-01-29 NOTE — Plan of Care (Signed)
  Problem: Clinical Measurements: Goal: Ability to maintain clinical measurements within normal limits will improve Outcome: Progressing Goal: Will remain free from infection Outcome: Progressing Goal: Diagnostic test results will improve Outcome: Progressing   Problem: Coping: Goal: Level of anxiety will decrease Outcome: Progressing   Problem: Pain Managment: Goal: General experience of comfort will improve and/or be controlled Outcome: Progressing

## 2024-01-29 NOTE — Op Note (Addendum)
 THIS EXAM WAS SENT IN ERROR

## 2024-01-29 NOTE — Discharge Summary (Signed)
 Physician Discharge Summary  Caitlin Anthony FMW:[id number] DOB: June 28, 2001 DOA: 01/26/2024  PCP: Loreli Elyn SAILOR, MD  Admit date: 01/26/2024 Discharge date: 01/29/2024  Admitted From: Home  Discharge disposition: Home   Recommendations for Outpatient Follow-Up:   Follow up with your primary care provider in one week.  Check CBC, BMP, magnesium in the next visit   Discharge Diagnosis:   Principal Problem:   Choledocholithiasis  Discharge Condition: Improved.  Diet recommendation: Soft diet and tolerated  Wound care: None.  Code status: Full.   History of Present Illness:   Caitlin Anthony is a 23 y.o. female with past medical history significant for asthma, morbid obesity status post lap chole with intraoperative cholangiogram on 01/15/2024 was admitted to the hospital with recurrent and worsening abdominal pain.  She had returned to the hospital and was admitted by the surgical service 6/19 to 6/22, was followed by GI as well and during that hospitalization had MRCP without abnormality.  She felt better with improved pain and was tolerating a bland diet and was discharged home on 6/22.  States that she was doing well for a short period of time, but then had recurrent severe pain with nausea without vomiting.  Patient then presented to the ED.  In the ED patient was noted to have elevated AST and ALT at 136 and 174 respectively.  Bilirubin at 1.0.  Lipase 27.  Urinalysis with 6-10 white cells repeat CT scan of the abdomen done on admission showed  progressively dilated biliary duct, but HIDA scan is unremarkable.  General surgery and GI was again consulted and patient was admitted to the hospital  for further evaluation and treatment.    Hospital Course:   Following conditions were addressed during hospitalization as listed below,  Persistent abdominal pain with elevated LFT and progressive dilatation of the biliary duct secondary to choledocholithiasis. CT scan of the abdomen this  admission showed CBD dilatation up to 12 mm without definite CBD stone.  HIDA scan done this admission without any evidence of bile leak but no excretion of radiotracer into the extrahepatic bile duct or common bile duct.  Recent admission with lap chole and intraoperative cholangiogram.   Previous MRCP recently showed mild CBD dilatation but no evidence of stricture.  Patient then underwent ERCP on 01/28/2024 with findings of choledocholithiasis.  Biliary sphincterotomy was done and stone was retried.  At this time LFTs have improved..Will follow GI recommendation.  Continue with IV fluids.  Still complains of intractable pain.   History of asthma.  Continue inhalers.  Compensated.   Mild hypokalemia.  Improved after replacement.  Latest potassium 3.6.   Mild hyponatremia.  Improved.  Latest sodium of 137   Class III obesity.  Body mass index is 60.09 kg/m. Patient would benefit from weight loss as outpatient.   Disposition.  At this time, patient is stable for disposition home with outpatient PCP and GI follow-up  Medical Consultants:   GI General Surgery  Procedures:    Biliary sphincterotomy and balloon extraction on 01/28/2024 Subjective:   Today, patient was seen and examined at bedside.  Feels better.  Has tolerated oral diet.  No pain.  LFTs are trending down.  Seen by GI and okay for discharge  Discharge Exam:   Vitals:   01/28/24 1937 01/29/24 0404  BP: 131/69 130/89  Pulse: (!) 59 73  Resp: 18 18  Temp: 99 F (37.2 C) 98 F (36.7 C)  SpO2: 100% 99%   Vitals:   01/28/24  1410 01/28/24 1500 01/28/24 1937 01/29/24 0404  BP: (!) 154/83 138/82 131/69 130/89  Pulse: 71 73 (!) 59 73  Resp: 12  18 18   Temp:   99 F (37.2 C) 98 F (36.7 C)  TempSrc:      SpO2: 97%  100% 99%  Weight:      Height:       Body mass index is 60.09 kg/m.  General: Alert awake, not in obvious distress HENT: pupils equally reacting to light,  No scleral pallor or icterus noted. Oral  mucosa is moist.  Chest:  Clear breath sounds.  Diminished breath sounds bilaterally. No crackles or wheezes.  CVS: S1 &S2 heard. No murmur.  Regular rate and rhythm. Abdomen: Soft, nontender, nondistended.  Bowel sounds are heard.   Extremities: No cyanosis, clubbing or edema.  Peripheral pulses are palpable. Psych: Alert, awake and oriented, normal mood CNS:  No cranial nerve deficits.  Power equal in all extremities.   Skin: Warm and dry.  No rashes noted.  The results of significant diagnostics from this hospitalization (including imaging, microbiology, ancillary and laboratory) are listed below for reference.     Diagnostic Studies:   NM HEPATOBILIARY LEAK (POST-SURGICAL) Result Date: 01/26/2024 CLINICAL DATA:  Postcholecystectomy 01/15/2023. Persistent abdominal pain. Multiple emergency department visits for RIGHT upper quadrant pain. Normal bilirubin EXAM: NUCLEAR MEDICINE HEPATOBILIARY IMAGING TECHNIQUE: Sequential images of the abdomen were obtained out to 60 minutes following intravenous administration of radiopharmaceutical. RADIOPHARMACEUTICALS:  5.5 mCi Tc-61m  Choletec  IV COMPARISON:  CT 01/26/2024, MRI abdomen 01/21/2024 FINDINGS: Prompt clearance radiotracer from blood pool and uniform uptake within liver. There is no excretion of radiotracer into the extrahepatic ducts at 1 hour imaging. At 2 hours imaging endpoint, still no radiotracer activity within the extrahepatic bile ducts or common bile duct. No evidence bile leak. IMPRESSION: 1. No evidence of bile leak (although evaluation limited with absent excretion of radiotracer) 2. No excretion of radiotracer into the extrahepatic bile duct or common bile duct. Findings would suggest common bile duct obstruction; however, the bilirubin is normal. Therefore, would consider hepatocellular dysfunction such as hepatitis or cholestasis limiting excretion of radiotracer. Electronically Signed   By: Jackquline Boxer M.D.   On: 01/26/2024  14:00   CT ABDOMEN PELVIS W CONTRAST Result Date: 01/26/2024 EXAM: CT ABDOMEN AND PELVIS WITH CONTRAST 01/26/2024 06:10:39 AM TECHNIQUE: CT of the abdomen and pelvis was performed with the administration of intravenous contrast. Multiplanar reformatted images are provided for review. Automated exposure control, iterative reconstruction, and/or weight based adjustment of the mA/kV was utilized to reduce the radiation dose to as low as reasonably achievable. COMPARISON: CT of the abdomen and pelvis with contrast 01/16/2024. MRI of the abdomen 01/21/2024. Liver ultrasound 01/22/2024. CLINICAL HISTORY: Abdominal pain, post-op. Abdominal pain x 2 days. Patient report worsening RUQ abdominal pain after eating [place] tonight. Patient report taking PRN pain medication without relief. Patient recently Discharge 2 days ago with same complain. Hx cholecystectomy 11 days ago, hx of Crohn's. FINDINGS: LOWER CHEST: Mild dependent atelectasis is present in both lung bases. LIVER: The fluid density collection in the gallbladder fossa has increased in size measuring up to 3 cm. No significant extrahepatic fluid collection is present. Mild intrahepatic biliary dilation is noted. GALLBLADDER AND BILE DUCTS: Cholecystectomy is again noted. The dilated common bile duct is slightly more prominent than on the prior studies, and now measuring up to 12 mm. No obstructing lesion is present. SPLEEN: No acute abnormality. PANCREAS: The pancreatic duct is  normal. The pancreas normally enhances. No mass lesion or cyst is present. ADRENAL GLANDS: No acute abnormality. KIDNEYS, URETERS AND BLADDER: No stones in the kidneys or ureters. No hydronephrosis. No perinephric or periureteral stranding. Urinary bladder is unremarkable. GI AND BOWEL: Stomach demonstrates no acute abnormality. There is no bowel obstruction. No bowel wall thickening. PERITONEUM AND RETROPERITONEUM: No ascites. No free air. A fat-containing periumbilical hernia measures 17  mm, stable VASCULATURE: Aorta is normal in caliber. LYMPH NODES: No lymphadenopathy. REPRODUCTIVE ORGANS: No acute abnormality. BONES AND SOFT TISSUES: No acute osseous abnormality. No focal soft tissue abnormality. IMPRESSION: 1. Mild intrahepatic biliary dilation and progressively dilated common bile duct, now measuring up to 12 mm, without obstructing lesion. This corresponds to the obstructive lab profile. 2. Increased size of fluid density collection in the gallbladder fossa, now measuring up to 3 cm. This most likely represents a postoperative seroma. Biliary leak is considered less likely without fluid elsewhere. Electronically signed by: Lonni Necessary MD 01/26/2024 06:34 AM EDT RP Workstation: [id number]     Labs:   Basic Metabolic Panel: Recent Labs  Lab 01/24/24 0819 01/26/24 0125 01/27/24 0434 01/28/24 0420 01/29/24 0414  NA 137 137 134* 140 137  K 3.7 3.6 3.4* 3.6 3.6  CL 106 106 102 104 104  CO2 20* 21* 23 24 23   GLUCOSE 93 131* 103* 107* 124*  BUN 6 8 <5* <5* 6  CREATININE 0.77 0.68 0.64 0.67 0.69  CALCIUM 9.1 8.8* 8.9 9.1 9.3  MG  --   --   --  2.1 2.1   GFR Estimated Creatinine Clearance: 166.3 mL/min (by C-G formula based on SCr of 0.69 mg/dL). Liver Function Tests: Recent Labs  Lab 01/24/24 0819 01/26/24 0125 01/27/24 0434 01/28/24 0420 01/29/24 0414  AST 89* 136* 326* 259* 154*  ALT 222* 174* 290* 292* 259*  ALKPHOS 211* 271* 364* 363* 356*  BILITOT 0.9 1.0 2.1* 2.0* 1.1  PROT 8.2* 8.1 7.6 7.6 8.1  ALBUMIN 3.6 3.6 3.2* 3.2* 3.5   Recent Labs  Lab 01/23/24 0540 01/26/24 0125  LIPASE 33 27   No results for input(s): AMMONIA in the last 168 hours. Coagulation profile No results for input(s): INR, PROTIME in the last 168 hours.  CBC: Recent Labs  Lab 01/23/24 0540 01/26/24 0125 01/27/24 0434 01/28/24 0420 01/29/24 0414  WBC 3.8* 5.6 3.5* 4.1 5.4  HGB 10.5* 10.0* 10.0* 10.2* 10.5*  HCT 37.0 34.8* 34.8* 35.4* 37.0  MCV 75.8* 74.8*  76.3* 75.8* 76.8*  PLT 356 369 333 352 379   Cardiac Enzymes: No results for input(s): CKTOTAL, CKMB, CKMBINDEX, TROPONINI in the last 168 hours. BNP: Invalid input(s): POCBNP CBG: No results for input(s): GLUCAP in the last 168 hours. D-Dimer No results for input(s): DDIMER in the last 72 hours. Hgb A1c No results for input(s): HGBA1C in the last 72 hours. Lipid Profile No results for input(s): CHOL, HDL, LDLCALC, TRIG, CHOLHDL, LDLDIRECT in the last 72 hours. Thyroid function studies No results for input(s): TSH, T4TOTAL, T3FREE, THYROIDAB in the last 72 hours.  Invalid input(s): FREET3 Anemia work up No results for input(s): VITAMINB12, FOLATE, FERRITIN, TIBC, IRON, RETICCTPCT in the last 72 hours. Microbiology No results found for this or any previous visit (from the past 240 hours).   Discharge Instructions:   Discharge Instructions     Call MD for:  persistant nausea and vomiting   Complete by: As directed    Call MD for:  severe uncontrolled pain   Complete by:  As directed    Diet general   Complete by: As directed    Discharge instructions   Complete by: As directed    Follow-up with your primary care provider in 1 week.  Check blood work at that time.  Follow-up with GI as outpatient as scheduled by the clinic.  Seek medical attention for worsening symptoms.   Increase activity slowly   Complete by: As directed       Allergies as of 01/29/2024       Reactions   Haloperidol  Decanoate Anxiety   Hydrocodone Itching, Rash   Pineapple Swelling   Also hives        Medication List     STOP taking these medications    acetaminophen  325 MG tablet Commonly known as: Tylenol    acetaminophen  500 MG tablet Commonly known as: TYLENOL    Lomaira 8 MG Tabs Generic drug: Phentermine HCl   methocarbamol  500 MG tablet Commonly known as: ROBAXIN    metoCLOPramide 10 MG tablet Commonly known as: REGLAN        TAKE these medications    albuterol  108 (90 Base) MCG/ACT inhaler Commonly known as: VENTOLIN  HFA Inhale 2 puffs into the lungs every 4 (four) hours as needed for wheezing or shortness of breath (cough).   budesonide-formoterol 160-4.5 MCG/ACT inhaler Commonly known as: SYMBICORT Inhale 2 puffs into the lungs 2 (two) times daily as needed.   dicyclomine  20 MG tablet Commonly known as: BENTYL  Take 1 tablet (20 mg total) by mouth 2 (two) times daily. What changed:  when to take this reasons to take this   ibuprofen  800 MG tablet Commonly known as: ADVIL  Take 1 tablet (800 mg total) by mouth every 8 (eight) hours as needed.   ondansetron  8 MG disintegrating tablet Commonly known as: ZOFRAN -ODT 8mg  ODT q8 hours prn nausea   oxyCODONE  5 MG immediate release tablet Commonly known as: Oxy IR/ROXICODONE  Take 1 tablet (5 mg total) by mouth every 6 (six) hours as needed for severe pain (pain score 7-10).   pantoprazole  40 MG tablet Commonly known as: Protonix  Take 1 tablet (40 mg total) by mouth daily for 15 days.        Follow-up Information     Loreli Elyn SAILOR, MD Follow up in 1 week(s).   Specialty: Family Medicine Contact information: [address] [place] [address] [phone]                  Time coordinating discharge: 39 minutes  Signed:  Oseas Detty  [facility] 01/29/2024, 3:45 PM

## 2024-01-30 ENCOUNTER — Encounter (HOSPITAL_COMMUNITY): Payer: Self-pay | Admitting: Gastroenterology

## 2024-02-10 NOTE — Telephone Encounter (Signed)
 Reviewed chart. Patient completed appointment with Dr. Hayes on 08/13/2023 for possible IBD.  Colonoscopy completed on 10/22/2023, no evidence of IBD. On 01/14/2024, patient admitted to [facility] for acute cholecystitis,  She had lap chole on 01/15/2024. Patient admitted to [facility] ref. Abdominal pain on 06/26.  Labs revealed elevated LFTs and bilirubin.  ERCP completed with biliary sphincterotomy and stone retrieval.  Would recommend she follow up GI at [facility] or Gen Surg at [facility].  These are still post surgical complaints.

## 2024-02-10 NOTE — Telephone Encounter (Signed)
 Returned call to [facility] [place] spoke with [place] . I advised of the message below . Nothing further needed.

## 2024-05-13 ENCOUNTER — Emergency Department (HOSPITAL_BASED_OUTPATIENT_CLINIC_OR_DEPARTMENT_OTHER)
Admission: EM | Admit: 2024-05-13 | Discharge: 2024-05-14 | Disposition: A | Discharge status: Home / Self Care | Payer: Self-pay | Attending: Emergency Medicine | Admitting: Emergency Medicine

## 2024-05-13 ENCOUNTER — Other Ambulatory Visit: Payer: Self-pay

## 2024-05-13 ENCOUNTER — Emergency Department (HOSPITAL_BASED_OUTPATIENT_CLINIC_OR_DEPARTMENT_OTHER): Payer: Self-pay

## 2024-05-13 ENCOUNTER — Emergency Department (HOSPITAL_BASED_OUTPATIENT_CLINIC_OR_DEPARTMENT_OTHER): Payer: Self-pay | Admitting: Radiology

## 2024-05-13 DIAGNOSIS — R0781 Pleurodynia: Secondary | ICD-10-CM | POA: Insufficient documentation

## 2024-05-13 DIAGNOSIS — R0789 Other chest pain: Secondary | ICD-10-CM | POA: Insufficient documentation

## 2024-05-13 LAB — BASIC METABOLIC PANEL WITH GFR
Anion gap: 13 (ref 5–15)
BUN: 8 mg/dL (ref 6–20)
CO2: 22 mmol/L (ref 22–32)
Calcium: 9.2 mg/dL (ref 8.9–10.3)
Chloride: 101 mmol/L (ref 98–111)
Creatinine, Ser: 0.73 mg/dL (ref 0.44–1.00)
GFR, Estimated: >60 mL/min (ref >60)
Glucose, Bld: 117 mg/dL — ABNORMAL HIGH (ref 70–99)
Potassium: 3.8 mmol/L (ref 3.5–5.1)
Sodium: 136 mmol/L (ref 135–145)

## 2024-05-13 LAB — TROPONIN T, HIGH SENSITIVITY: Troponin T High Sensitivity: <15 ng/L (ref 0–19)

## 2024-05-13 LAB — CBC
HCT: 32.1 % — ABNORMAL LOW (ref 36.0–46.0)
Hemoglobin: 9.6 g/dL — ABNORMAL LOW (ref 12.0–15.0)
MCH: 21 pg — ABNORMAL LOW (ref 26.0–34.0)
MCHC: 29.9 g/dL — ABNORMAL LOW (ref 30.0–36.0)
MCV: 70.1 fL — ABNORMAL LOW (ref 80.0–100.0)
Platelets: 356 K/uL (ref 150–400)
RBC: 4.58 MIL/uL (ref 3.87–5.11)
RDW: 17.3 % — ABNORMAL HIGH (ref 11.5–15.5)
WBC: 4.7 K/uL (ref 4.0–10.5)
nRBC: 0 % (ref 0.0–0.2)

## 2024-05-13 LAB — D-DIMER, QUANTITATIVE: D-Dimer, Quant: 1.11 ug{FEU}/mL — ABNORMAL HIGH (ref 0.00–0.50)

## 2024-05-13 MED ORDER — SODIUM CHLORIDE 0.9 % IV BOLUS
500.0000 mL | Freq: Once | INTRAVENOUS | Status: AC
  Administered 2024-05-13: 500 mL via INTRAVENOUS

## 2024-05-13 MED ORDER — MORPHINE SULFATE (PF) 4 MG/ML IV SOLN
4.0000 mg | Freq: Once | INTRAVENOUS | Status: AC
  Administered 2024-05-13: 4 mg via INTRAVENOUS
  Filled 2024-05-13: qty 1

## 2024-05-13 NOTE — ED Provider Notes (Signed)
 Care of patient received from prior provider at 11:07 PM, please see their note for complete H/P and care plan.  Received handoff per ED course.  Clinical Course as of 05/14/24 9660  [place] May 13, 2024  2300 Patient to ED with left chest pain that is pleuritic, radiates to back, starting today. No fever or symptoms of illness. Mildly tachycardic. CXR clear - no infection, infiltrates, consolidation. Troponin negative. She is mildly anemic. No leukocytosis. D-dimer added. EKG reviewed with Dr. Jerral, concerning for inferior T-wave inversions. Second troponin added. Patient care signed out to dr. Jerral pending final lab results and patient re-evaluation.  [SU]  2305 Chest pain HO from SU Serial trop and Ddimer pending. Has inferior T wave inversions.  They were present on prior EKGs though slightly more pronounced today.  Initial troponin negative, follow-up pending.  Patient tachycardic therefore falls outside of [facility] criteria.  Low Geneva score.  Will proceed with D-dimer.  Years negative. Reassessment after medication, follow-up labs. [CC]    Clinical Course User Index [CC] Jerral Meth, MD [SU] Odell Balls, PA-C    Reassessment: D-dimer positive.  CTA performed shows possible developing pneumonia.  Given patient's cough and shortness of breath, will treat with doxycycline.  She endorsed no concern for pregnancy. Recommended follow-up with PCP in 48 hours for reassessment or returning to the emergency department if unable to obtain outpatient follow-up.     Jerral Meth, MD 05/14/24 [phone]

## 2024-05-13 NOTE — ED Triage Notes (Signed)
 Pt POV reporting L side chest pain, began this morning, worsens when taking a deep breath, tearful in triage.

## 2024-05-13 NOTE — ED Provider Notes (Signed)
 [place] EMERGENCY DEPARTMENT AT [facility] Provider Note   CSN: [id number] Arrival date & time: 05/13/24  2035     Patient presents with: Chest Pain   Caitlin Anthony is a 23 y.o. female.   Patient to ED for evaluation of chest pain that started at 6:00 am this morning without provocation. No history of same. She reports constant, sharp, left sided chest pain that is worse with respirations. No cough or fever. The pain radiates to the back. She has taken OTC medications without relief. No nausea, vomiting or abdominal pain. No recent travel, exogenous estrogens, history of clots, leg pain.   The history is provided by the patient. No language interpreter was used.  Chest Pain      Prior to Admission medications   Medication Sig Start Date End Date Taking? Authorizing Provider  albuterol  (VENTOLIN  HFA) 108 (90 Base) MCG/ACT inhaler Inhale 2 puffs into the lungs every 4 (four) hours as needed for wheezing or shortness of breath (cough). 09/01/23   Roselyn Carlin NOVAK, MD  budesonide-formoterol [facility]) 160-4.5 MCG/ACT inhaler Inhale 2 puffs into the lungs 2 (two) times daily as needed. 09/15/23 09/14/24  [provider]  dicyclomine  (BENTYL ) 20 MG tablet Take 1 tablet (20 mg total) by mouth 2 (two) times daily. Patient taking differently: Take 20 mg by mouth 2 (two) times daily as needed for spasms. 01/16/24   Palumbo, April, MD  ibuprofen  (ADVIL ) 800 MG tablet Take 1 tablet (800 mg total) by mouth every 8 (eight) hours as needed. 01/15/24   Kinsinger, Herlene Righter, MD  ondansetron  (ZOFRAN -ODT) 8 MG disintegrating tablet 8mg  ODT q8 hours prn nausea 01/16/24   Palumbo, April, MD  oxyCODONE  (OXY IR/ROXICODONE ) 5 MG immediate release tablet Take 1 tablet (5 mg total) by mouth every 6 (six) hours as needed for severe pain (pain score 7-10). 01/15/24   Kinsinger, Herlene Righter, MD  pantoprazole  (PROTONIX ) 40 MG tablet Take 1 tablet (40 mg total) by mouth daily for 15 days. 01/29/24  02/13/24  Pokhrel, Laxman, MD    Allergies: Haloperidol  decanoate, Hydrocodone, and Pineapple    Review of Systems  Cardiovascular:  Positive for chest pain.    Updated Vital Signs BP (!) 141/96 (BP Location: Right Wrist)   Pulse (!) 107   Temp 99.4 F (37.4 C) (Oral)   Resp 20   Ht 5' 3 (1.6 m)   LMP 05/12/2024 (Exact Date)   SpO2 100%   BMI 62.02 kg/m   Physical Exam Vitals and nursing note reviewed.  Constitutional:      Appearance: She is well-developed. She is obese.     Comments: Uncomfortable appearing.  HENT:     Head: Normocephalic.  Cardiovascular:     Rate and Rhythm: Normal rate and regular rhythm.     Heart sounds: No murmur heard. Pulmonary:     Effort: Pulmonary effort is normal.     Breath sounds: Normal breath sounds. No wheezing, rhonchi or rales.  Chest:     Chest wall: Tenderness (Left parasternal tenderness.) present.  Abdominal:     General: Bowel sounds are normal.     Palpations: Abdomen is soft.     Tenderness: There is no abdominal tenderness. There is no guarding or rebound.  Musculoskeletal:        General: No tenderness. Normal range of motion.     Cervical back: Normal range of motion and neck supple.  Skin:    General: Skin is warm and dry.  Neurological:  General: No focal deficit present.     Mental Status: She is alert and oriented to person, place, and time.     (all labs ordered are listed, but only abnormal results are displayed) Labs Reviewed  BASIC METABOLIC PANEL WITH GFR - Abnormal; Notable for the following components:      Result Value   Glucose, Bld 117 (*)    All other components within normal limits  CBC - Abnormal; Notable for the following components:   Hemoglobin 9.6 (*)    HCT 32.1 (*)    MCV 70.1 (*)    MCH 21.0 (*)    MCHC 29.9 (*)    RDW 17.3 (*)    All other components within normal limits  PREGNANCY, URINE  D-DIMER, QUANTITATIVE  TROPONIN T, HIGH SENSITIVITY   Results for orders placed or  performed during the hospital encounter of 05/13/24  Basic metabolic panel   Collection Time: 05/13/24 10:20 PM  Result Value Ref Range   Sodium 136 135 - 145 mmol/L   Potassium 3.8 3.5 - 5.1 mmol/L   Chloride 101 98 - 111 mmol/L   CO2 22 22 - 32 mmol/L   Glucose, Bld 117 (H) 70 - 99 mg/dL   BUN 8 6 - 20 mg/dL   Creatinine, Ser 9.26 0.44 - 1.00 mg/dL   Calcium 9.2 8.9 - 89.6 mg/dL   GFR, Estimated >39 >39 mL/min   Anion gap 13 5 - 15  CBC   Collection Time: 05/13/24 10:20 PM  Result Value Ref Range   WBC 4.7 4.0 - 10.5 K/uL   RBC 4.58 3.87 - 5.11 MIL/uL   Hemoglobin 9.6 (L) 12.0 - 15.0 g/dL   HCT 67.8 (L) 63.9 - 53.9 %   MCV 70.1 (L) 80.0 - 100.0 fL   MCH 21.0 (L) 26.0 - 34.0 pg   MCHC 29.9 (L) 30.0 - 36.0 g/dL   RDW 82.6 (H) 88.4 - 84.4 %   Platelets 356 150 - 400 K/uL   nRBC 0.0 0.0 - 0.2 %  Troponin T, High Sensitivity   Collection Time: 05/13/24 10:20 PM  Result Value Ref Range   Troponin T High Sensitivity <15 0 - 19 ng/L     EKG: EKG Interpretation Date/Time:  Friday May 13 2024 20:46:49 EDT Ventricular Rate:  105 PR Interval:  130 QRS Duration:  94 QT Interval:  322 QTC Calculation: 425 R Axis:   92  Text Interpretation: Sinus tachycardia Rightward axis T wave abnormality, consider inferior ischemia T wave abnormality, consider anterolateral ischemia Abnormal ECG When compared with ECG of 14-Jan-2024 19:29, PREVIOUS ECG IS PRESENT Confirmed by Jerral Meth [phone]) on 05/13/2024 11:07:00 PM  Radiology: [facility] Chest Port 1 View Result Date: 05/13/2024 CLINICAL DATA:  Left-sided chest pain EXAM: PORTABLE CHEST 1 VIEW COMPARISON:  01/14/2024 FINDINGS: Cardiac shadow is prominent but accentuated by the frontal technique. The overall inspiratory effort is poor. The lungs are clear however. No bony abnormality is noted. IMPRESSION: No acute abnormality noted. Electronically Signed   By: Oneil Devonshire M.D.   On: 05/13/2024 22:10     Procedures   Medications  Ordered in the ED  sodium chloride  0.9 % bolus 500 mL (has no administration in time range)  morphine  (PF) 4 MG/ML injection 4 mg (4 mg Intravenous Given 05/13/24 2322)    Clinical Course as of 05/13/24 2323  Fri May 13, 2024  2300 Patient to ED with left chest pain that is pleuritic, radiates to back, starting today.  No fever or symptoms of illness. Mildly tachycardic. CXR clear - no infection, infiltrates, consolidation. Troponin negative. She is mildly anemic. No leukocytosis. D-dimer added. EKG reviewed with Dr. Jerral, concerning for inferior T-wave inversions. Second troponin added. Patient care signed out to dr. Jerral pending final lab results and patient re-evaluation.  [SU]  2305 Chest pain HO from SU Serial trop and Ddimer pending. Has inferior T wave inversions.  They were present on prior EKGs though slightly more pronounced today.  Initial troponin negative, follow-up pending.  Patient tachycardic therefore falls outside of [facility] criteria.  Low Geneva score.  Will proceed with D-dimer.  Years negative. Reassessment after medication, follow-up labs. [CC]    Clinical Course User Index [CC] Jerral Meth, MD [SU] Odell Balls, PA-C                                 Medical Decision Making Amount and/or Complexity of Data Reviewed Labs: ordered. Radiology: ordered.  Risk Prescription drug management.        Final diagnoses:  Other chest pain  Pleuritic pain    ED Discharge Orders     None          Odell Balls, PA-C 05/13/24 2323    Doretha Folks, MD 05/16/24 2328

## 2024-05-14 ENCOUNTER — Emergency Department (HOSPITAL_BASED_OUTPATIENT_CLINIC_OR_DEPARTMENT_OTHER): Payer: Self-pay

## 2024-05-14 LAB — TROPONIN T, HIGH SENSITIVITY: Troponin T High Sensitivity: <15 ng/L (ref 0–19)

## 2024-05-14 MED ORDER — KETOROLAC TROMETHAMINE 15 MG/ML IJ SOLN
15.0000 mg | Freq: Once | INTRAMUSCULAR | Status: AC
  Administered 2024-05-14: 15 mg via INTRAVENOUS
  Filled 2024-05-14: qty 1

## 2024-05-14 MED ORDER — DOXYCYCLINE HYCLATE 100 MG PO TABS
100.0000 mg | ORAL_TABLET | Freq: Once | ORAL | Status: AC
Start: 2024-05-14 — End: 2024-05-14
  Administered 2024-05-14: 100 mg via ORAL
  Filled 2024-05-14: qty 1

## 2024-05-14 MED ORDER — CELECOXIB 200 MG PO CAPS: 200.0000 mg | ORAL_CAPSULE | Freq: Two times a day (BID) | ORAL | 0 refills | Status: AC

## 2024-05-14 MED ORDER — IOHEXOL 350 MG/ML SOLN
100.0000 mL | Freq: Once | INTRAVENOUS | Status: AC | PRN
  Administered 2024-05-14: 100 mL via INTRAVENOUS

## 2024-05-14 MED ORDER — DOXYCYCLINE HYCLATE 100 MG PO CAPS: 100.0000 mg | ORAL_CAPSULE | Freq: Two times a day (BID) | ORAL | 0 refills | Status: AC

## 2024-05-14 NOTE — ED Notes (Signed)
 Patient transported to CT

## 2024-06-19 ENCOUNTER — Other Ambulatory Visit: Payer: Self-pay

## 2024-06-19 ENCOUNTER — Emergency Department (HOSPITAL_BASED_OUTPATIENT_CLINIC_OR_DEPARTMENT_OTHER)
Admission: EM | Admit: 2024-06-19 | Discharge: 2024-06-20 | Disposition: A | Discharge status: Home / Self Care | Payer: Self-pay | Attending: Emergency Medicine | Admitting: Emergency Medicine

## 2024-06-19 ENCOUNTER — Encounter (HOSPITAL_BASED_OUTPATIENT_CLINIC_OR_DEPARTMENT_OTHER): Payer: Self-pay | Admitting: Emergency Medicine

## 2024-06-19 DIAGNOSIS — Z79899 Other long term (current) drug therapy: Secondary | ICD-10-CM | POA: Insufficient documentation

## 2024-06-19 DIAGNOSIS — R1013 Epigastric pain: Secondary | ICD-10-CM | POA: Insufficient documentation

## 2024-06-19 DIAGNOSIS — G8929 Other chronic pain: Secondary | ICD-10-CM | POA: Insufficient documentation

## 2024-06-19 DIAGNOSIS — R7401 Elevation of levels of liver transaminase levels: Secondary | ICD-10-CM | POA: Insufficient documentation

## 2024-06-19 LAB — COMPREHENSIVE METABOLIC PANEL WITH GFR
ALT: 19 U/L (ref 0–44)
AST: 28 U/L (ref 15–41)
Albumin: 4.4 g/dL (ref 3.5–5.0)
Alkaline Phosphatase: 78 U/L (ref 38–126)
Anion gap: 12 (ref 5–15)
BUN: 11 mg/dL (ref 6–20)
CO2: 24 mmol/L (ref 22–32)
Calcium: 9.7 mg/dL (ref 8.9–10.3)
Chloride: 103 mmol/L (ref 98–111)
Creatinine, Ser: 0.77 mg/dL (ref 0.44–1.00)
GFR, Estimated: >60 mL/min (ref >60)
Glucose, Bld: 93 mg/dL (ref 70–99)
Potassium: 3.7 mmol/L (ref 3.5–5.1)
Sodium: 139 mmol/L (ref 135–145)
Total Bilirubin: 0.3 mg/dL (ref 0.0–1.2)
Total Protein: 8.9 g/dL — ABNORMAL HIGH (ref 6.5–8.1)

## 2024-06-19 LAB — LIPASE, BLOOD: Lipase: 21 U/L (ref 11–51)

## 2024-06-19 LAB — URINALYSIS, ROUTINE W REFLEX MICROSCOPIC
Bilirubin Urine: NEGATIVE
Glucose, UA: NEGATIVE mg/dL
Hgb urine dipstick: NEGATIVE
Ketones, ur: NEGATIVE mg/dL
Leukocytes,Ua: NEGATIVE
Nitrite: NEGATIVE
Protein, ur: 30 mg/dL — AB
Specific Gravity, Urine: >=1.03 (ref 1.005–1.030)
pH: 7 (ref 5.0–8.0)

## 2024-06-19 LAB — CBC WITH DIFFERENTIAL/PLATELET
Abs Immature Granulocytes: 0.02 K/uL (ref 0.00–0.07)
Basophils Absolute: 0 K/uL (ref 0.0–0.1)
Basophils Relative: 0 %
Eosinophils Absolute: 0.1 K/uL (ref 0.0–0.5)
Eosinophils Relative: 2 %
HCT: 35.7 % — ABNORMAL LOW (ref 36.0–46.0)
Hemoglobin: 10.5 g/dL — ABNORMAL LOW (ref 12.0–15.0)
Immature Granulocytes: 0 %
Lymphocytes Relative: 30 %
Lymphs Abs: 2 K/uL (ref 0.7–4.0)
MCH: 20.5 pg — ABNORMAL LOW (ref 26.0–34.0)
MCHC: 29.4 g/dL — ABNORMAL LOW (ref 30.0–36.0)
MCV: 69.7 fL — ABNORMAL LOW (ref 80.0–100.0)
Monocytes Absolute: 0.5 K/uL (ref 0.1–1.0)
Monocytes Relative: 8 %
Neutro Abs: 3.9 K/uL (ref 1.7–7.7)
Neutrophils Relative %: 60 %
Platelets: 471 K/uL — ABNORMAL HIGH (ref 150–400)
RBC: 5.12 MIL/uL — ABNORMAL HIGH (ref 3.87–5.11)
RDW: 18 % — ABNORMAL HIGH (ref 11.5–15.5)
WBC: 6.5 K/uL (ref 4.0–10.5)
nRBC: 0 % (ref 0.0–0.2)

## 2024-06-19 LAB — URINALYSIS, MICROSCOPIC (REFLEX): WBC, UA: NONE SEEN WBC/hpf (ref 0–5)

## 2024-06-19 LAB — PREGNANCY, URINE: Preg Test, Ur: NEGATIVE

## 2024-06-19 MED ORDER — ONDANSETRON HCL 4 MG/2ML IJ SOLN
4.0000 mg | Freq: Once | INTRAMUSCULAR | Status: AC
  Administered 2024-06-19: 4 mg via INTRAVENOUS
  Filled 2024-06-19: qty 2

## 2024-06-19 MED ORDER — MORPHINE SULFATE (PF) 4 MG/ML IV SOLN
4.0000 mg | Freq: Once | INTRAVENOUS | Status: AC
  Administered 2024-06-19: 4 mg via INTRAVENOUS
  Filled 2024-06-19: qty 1

## 2024-06-19 NOTE — ED Triage Notes (Signed)
 Pt reports RUQ s/p chole 6 mos ago; the pain has been persistent since surg, but worse lately; +NVD x a couple of weeks

## 2024-06-19 NOTE — ED Provider Notes (Signed)
 [place] [facility]  Provider Note  CSN: [id number] Arrival date & time: 06/19/24 2131  History Chief Complaint  Patient presents with   Abdominal Pain    Caitlin Anthony is a 23 y.o. female who is approx 5 months s/p emergent cholecystectomy had a complicated post-op course of pain, elevated LFTs and biliary ductal dilatation from a retained CBD stone. She has continued to have occasional pangs of post-prandial pain which have been tolerable but in the last 3 weeks pain has been more severe and more persistent. Some nausea but no vomiting. No constipation. No fevers.    Home Medications Prior to Admission medications   Medication Sig Start Date End Date Taking? Authorizing Provider  albuterol  (VENTOLIN  HFA) 108 (90 Base) MCG/ACT inhaler Inhale 2 puffs into the lungs every 4 (four) hours as needed for wheezing or shortness of breath (cough). 09/01/23   Roselyn Carlin NOVAK, MD  budesonide-formoterol [facility]) 160-4.5 MCG/ACT inhaler Inhale 2 puffs into the lungs 2 (two) times daily as needed. 09/15/23 09/14/24  [provider]  celecoxib (CELEBREX) 200 MG capsule Take 1 capsule (200 mg total) by mouth 2 (two) times daily. 05/14/24   Jerral Meth, MD  dicyclomine  (BENTYL ) 20 MG tablet Take 1 tablet (20 mg total) by mouth 2 (two) times daily. Patient taking differently: Take 20 mg by mouth 2 (two) times daily as needed for spasms. 01/16/24   Palumbo, April, MD  doxycycline (VIBRAMYCIN) 100 MG capsule Take 1 capsule (100 mg total) by mouth 2 (two) times daily. 05/14/24   Jerral Meth, MD  ibuprofen  (ADVIL ) 800 MG tablet Take 1 tablet (800 mg total) by mouth every 8 (eight) hours as needed. 01/15/24   Kinsinger, Herlene Righter, MD  ondansetron  (ZOFRAN -ODT) 8 MG disintegrating tablet 8mg  ODT q8 hours prn nausea 01/16/24   Palumbo, April, MD  oxyCODONE  (OXY IR/ROXICODONE ) 5 MG immediate release tablet Take 1 tablet (5 mg total) by mouth every 6 (six)  hours as needed for severe pain (pain score 7-10). 01/15/24   Kinsinger, Herlene Righter, MD  pantoprazole  (PROTONIX ) 40 MG tablet Take 1 tablet (40 mg total) by mouth daily for 15 days. 01/29/24 02/13/24  Pokhrel, Laxman, MD     Allergies    Haloperidol  decanoate, Hydrocodone, and Pineapple   Review of Systems   Review of Systems Please see HPI for pertinent positives and negatives  Physical Exam BP (!) 137/56 (BP Location: Right Arm)   Pulse 93   Temp 98.8 F (37.1 C) (Oral)   Resp 14   Ht 5' 4 (1.626 m)   Wt (!) 140.6 kg   SpO2 99%   BMI 53.21 kg/m   Physical Exam Vitals and nursing note reviewed.  Constitutional:      Appearance: Normal appearance.  HENT:     Head: Normocephalic and atraumatic.     Nose: Nose normal.     Mouth/Throat:     Mouth: Mucous membranes are moist.  Eyes:     Extraocular Movements: Extraocular movements intact.     Conjunctiva/sclera: Conjunctivae normal.  Cardiovascular:     Rate and Rhythm: Normal rate.  Pulmonary:     Effort: Pulmonary effort is normal.     Breath sounds: Normal breath sounds.  Abdominal:     General: Abdomen is flat.     Palpations: Abdomen is soft.     Tenderness: There is abdominal tenderness in the epigastric area. There is no guarding. Negative signs include Murphy's sign and McBurney's  sign.  Musculoskeletal:        General: No swelling. Normal range of motion.     Cervical back: Neck supple.  Skin:    General: Skin is warm and dry.  Neurological:     General: No focal deficit present.     Mental Status: She is alert.  Psychiatric:        Mood and Affect: Mood normal.     ED Results / Procedures / Treatments   EKG None  Procedures Procedures  Medications Ordered in the ED Medications  morphine  (PF) 4 MG/ML injection 4 mg (has no administration in time range)  ondansetron  (ZOFRAN ) injection 4 mg (has no administration in time range)    Initial Impression and Plan  Patient here with worsening  epigastric pain, similar pains since lap chole in June but more severe in the last 2-3 weeks. Some tenderness on exam but no peritoneal signs. Labs done in triage show unremarkable CBC, CMP, lipase and UA. Will send for CT to rule out other acute issues. Pain/nausea meds for comfort.   ED Course       MDM Rules/Calculators/A&P Medical Decision Making Amount and/or Complexity of Data Reviewed Labs: ordered. Radiology: ordered.  Risk Prescription drug management.     Final Clinical Impression(s) / ED Diagnoses Final diagnoses:  None    Rx / DC Orders ED Discharge Orders     None

## 2024-06-20 ENCOUNTER — Emergency Department (HOSPITAL_BASED_OUTPATIENT_CLINIC_OR_DEPARTMENT_OTHER): Payer: Self-pay

## 2024-06-20 MED ORDER — IOHEXOL 300 MG/ML  SOLN
125.0000 mL | Freq: Once | INTRAMUSCULAR | Status: AC | PRN
  Administered 2024-06-20: 125 mL via INTRAVENOUS

## 2024-07-22 ENCOUNTER — Emergency Department (HOSPITAL_BASED_OUTPATIENT_CLINIC_OR_DEPARTMENT_OTHER): Payer: Self-pay | Admitting: Radiology

## 2024-07-22 ENCOUNTER — Emergency Department (HOSPITAL_BASED_OUTPATIENT_CLINIC_OR_DEPARTMENT_OTHER)
Admission: EM | Admit: 2024-07-22 | Discharge: 2024-07-22 | Disposition: A | Discharge status: Home / Self Care | Payer: Self-pay | Attending: Emergency Medicine | Admitting: Emergency Medicine

## 2024-07-22 ENCOUNTER — Other Ambulatory Visit: Payer: Self-pay

## 2024-07-22 ENCOUNTER — Encounter (HOSPITAL_BASED_OUTPATIENT_CLINIC_OR_DEPARTMENT_OTHER): Payer: Self-pay | Admitting: Emergency Medicine

## 2024-07-22 DIAGNOSIS — A084 Viral intestinal infection, unspecified: Secondary | ICD-10-CM | POA: Insufficient documentation

## 2024-07-22 LAB — PREGNANCY, URINE: Preg Test, Ur: NEGATIVE

## 2024-07-22 LAB — BASIC METABOLIC PANEL WITH GFR
Anion gap: 12 (ref 5–15)
BUN: 9 mg/dL (ref 6–20)
CO2: 21 mmol/L — ABNORMAL LOW (ref 22–32)
Calcium: 9.1 mg/dL (ref 8.9–10.3)
Chloride: 105 mmol/L (ref 98–111)
Creatinine, Ser: 0.69 mg/dL (ref 0.44–1.00)
GFR, Estimated: >60 mL/min
Glucose, Bld: 89 mg/dL (ref 70–99)
Potassium: 3.6 mmol/L (ref 3.5–5.1)
Sodium: 138 mmol/L (ref 135–145)

## 2024-07-22 LAB — CBC
HCT: 35.2 % — ABNORMAL LOW (ref 36.0–46.0)
Hemoglobin: 10.1 g/dL — ABNORMAL LOW (ref 12.0–15.0)
MCH: 20.1 pg — ABNORMAL LOW (ref 26.0–34.0)
MCHC: 28.7 g/dL — ABNORMAL LOW (ref 30.0–36.0)
MCV: 70 fL — ABNORMAL LOW (ref 80.0–100.0)
Platelets: 395 K/uL (ref 150–400)
RBC: 5.03 MIL/uL (ref 3.87–5.11)
RDW: 18.2 % — ABNORMAL HIGH (ref 11.5–15.5)
WBC: 4.1 K/uL (ref 4.0–10.5)
nRBC: 0 % (ref 0.0–0.2)

## 2024-07-22 LAB — TROPONIN T, HIGH SENSITIVITY
Troponin T High Sensitivity: <15 ng/L (ref 0–19)
Troponin T High Sensitivity: <15 ng/L (ref 0–19)

## 2024-07-22 MED ORDER — ONDANSETRON HCL 4 MG PO TABS: 4.0000 mg | ORAL_TABLET | Freq: Three times a day (TID) | ORAL | 0 refills | Status: AC | PRN

## 2024-07-22 MED ORDER — ONDANSETRON HCL 4 MG/2ML IJ SOLN
4.0000 mg | Freq: Once | INTRAMUSCULAR | Status: AC
  Administered 2024-07-22: 4 mg via INTRAVENOUS
  Filled 2024-07-22: qty 2

## 2024-07-22 MED ORDER — KETOROLAC TROMETHAMINE 30 MG/ML IJ SOLN
30.0000 mg | Freq: Once | INTRAMUSCULAR | Status: AC
  Administered 2024-07-22: 30 mg via INTRAVENOUS
  Filled 2024-07-22: qty 1

## 2024-07-22 NOTE — ED Triage Notes (Addendum)
 2nd day epigastric pain. Radiates into back and central chest. Worse with eating and movement. Palpitations feelings and some SOB- has not needed inhaler today.

## 2024-07-22 NOTE — Discharge Instructions (Addendum)
 You were seen in the emerged department for nausea vomiting diarrhea chest pain Your cardiac test did not show evidence of a heart attack Your COVID flu RSV test were negative Your pregnancy test was negative This is likely another virus causing your symptoms We called in prescription for Zofran  for you to pick up in your pharmacy begin taking as directed for nausea and vomiting Keep well-hydrated at home Return to the emerged from for worsening symptoms

## 2024-07-22 NOTE — ED Provider Notes (Signed)
 " [place] [facility] AT [facility] Provider Note   CSN: [id number] Arrival date & time: 07/22/24  1842     Patient presents with: Chest Pain   Caitlin Anthony is a 23 y.o. female.  Who presents to the ED for abdominal pain.  2 days of epigastric pain nausea diarrhea.  No fevers or respiratory symptoms.  No dysuria    Chest Pain      Prior to Admission medications  Medication Sig Start Date End Date Taking? Authorizing Provider  ondansetron  (ZOFRAN ) 4 MG tablet Take 1 tablet (4 mg total) by mouth every 8 (eight) hours as needed for up to 4 days for nausea or vomiting. 07/22/24 07/26/24 Yes Pamella Ozell LABOR, DO  albuterol  (VENTOLIN  HFA) 108 (90 Base) MCG/ACT inhaler Inhale 2 puffs into the lungs every 4 (four) hours as needed for wheezing or shortness of breath (cough). 09/01/23   Roselyn Carlin NOVAK, MD  budesonide-formoterol [facility]) 160-4.5 MCG/ACT inhaler Inhale 2 puffs into the lungs 2 (two) times daily as needed. 09/15/23 09/14/24  [provider]  celecoxib  (CELEBREX ) 200 MG capsule Take 1 capsule (200 mg total) by mouth 2 (two) times daily. 05/14/24   Jerral Meth, MD  dicyclomine  (BENTYL ) 20 MG tablet Take 1 tablet (20 mg total) by mouth 2 (two) times daily. Patient taking differently: Take 20 mg by mouth 2 (two) times daily as needed for spasms. 01/16/24   Palumbo, April, MD  doxycycline  (VIBRAMYCIN ) 100 MG capsule Take 1 capsule (100 mg total) by mouth 2 (two) times daily. 05/14/24   Jerral Meth, MD  ibuprofen  (ADVIL ) 800 MG tablet Take 1 tablet (800 mg total) by mouth every 8 (eight) hours as needed. 01/15/24   Kinsinger, Herlene Righter, MD  ondansetron  (ZOFRAN -ODT) 8 MG disintegrating tablet 8mg  ODT q8 hours prn nausea 01/16/24   Palumbo, April, MD  oxyCODONE  (OXY IR/ROXICODONE ) 5 MG immediate release tablet Take 1 tablet (5 mg total) by mouth every 6 (six) hours as needed for severe pain (pain score 7-10). 01/15/24   Kinsinger, Herlene Righter, MD   pantoprazole  (PROTONIX ) 40 MG tablet Take 1 tablet (40 mg total) by mouth daily for 15 days. 01/29/24 02/13/24  Pokhrel, Laxman, MD    Allergies: Haloperidol  decanoate, Hydrocodone, and Pineapple    Review of Systems  Cardiovascular:  Positive for chest pain.    Updated Vital Signs BP 123/73 (BP Location: Left Arm)   Pulse 89   Temp 98.1 F (36.7 C) (Oral)   Resp 18   SpO2 100%   Physical Exam Vitals and nursing note reviewed.  Constitutional:      Appearance: She is obese.  HENT:     Head: Normocephalic and atraumatic.  Eyes:     Pupils: Pupils are equal, round, and reactive to light.  Cardiovascular:     Rate and Rhythm: Normal rate and regular rhythm.  Pulmonary:     Effort: Pulmonary effort is normal.     Breath sounds: Normal breath sounds.  Abdominal:     Palpations: Abdomen is soft.     Tenderness: There is no abdominal tenderness.     Comments: Mild epigastric tenderness  Skin:    General: Skin is warm and dry.  Neurological:     Mental Status: She is alert.  Psychiatric:        Mood and Affect: Mood normal.     (all labs ordered are listed, but only abnormal results are displayed) Labs Reviewed  BASIC METABOLIC PANEL WITH GFR -  Abnormal; Notable for the following components:      Result Value   CO2 21 (*)    All other components within normal limits  CBC - Abnormal; Notable for the following components:   Hemoglobin 10.1 (*)    HCT 35.2 (*)    MCV 70.0 (*)    MCH 20.1 (*)    MCHC 28.7 (*)    RDW 18.2 (*)    All other components within normal limits  PREGNANCY, URINE  TROPONIN T, HIGH SENSITIVITY  TROPONIN T, HIGH SENSITIVITY    EKG: EKG Interpretation Date/Time:  Friday July 22 2024 19:22:22 EST Ventricular Rate:  98 PR Interval:  162 QRS Duration:  89 QT Interval:  333 QTC Calculation: 426 R Axis:   82  Text Interpretation: Sinus rhythm Confirmed by Pamella Sharper [phone]) on 07/22/2024 10:09:46 PM  Radiology: [facility] Chest 2  View Result Date: 07/22/2024 CLINICAL DATA:  Chest pain EXAM: CHEST - 2 VIEW COMPARISON:  05/13/2024 FINDINGS: The heart size and mediastinal contours are within normal limits. Both lungs are clear. The visualized skeletal structures are unremarkable. IMPRESSION: No active cardiopulmonary disease. Electronically Signed   By: Luke Bun M.D.   On: 07/22/2024 20:00     Procedures   Medications Ordered in the ED  ondansetron  (ZOFRAN ) injection 4 mg (4 mg Intravenous Given 07/22/24 2216)  ketorolac  (TORADOL ) 30 MG/ML injection 30 mg (30 mg Intravenous Given 07/22/24 2216)                                    Medical Decision Making 23 year old female with history as above presented to ED for 2 days of epigastric pain nausea diarrhea.  Laboratory workup unremarkable pregnancy negative cardiac workup not consistent with ACS.  Chest x-ray was clear.  Viral panel negative.  Suspect viral GI illness.  Counseled her on symptomatic management will discharge with Zofran  instructed for PCP follow-up  Amount and/or Complexity of Data Reviewed Labs: ordered. Radiology: ordered.  Risk Prescription drug management.        Final diagnoses:  Viral gastroenteritis    ED Discharge Orders          Ordered    ondansetron  (ZOFRAN ) 4 MG tablet  Every 8 hours PRN        07/22/24 2211               Pamella Sharper LABOR, DO 07/22/24 2231  "
